# Patient Record
Sex: Female | Born: 1970 | Race: White | Hispanic: No | Marital: Single | Smoking: Never smoker
Health system: Southern US, Community
[De-identification: ages and names within clinical notes are randomized; demographics above are authoritative.]

## PROBLEM LIST (undated history)

## (undated) DIAGNOSIS — T1491XA Suicide attempt, initial encounter: Secondary | ICD-10-CM

## (undated) DIAGNOSIS — N3000 Acute cystitis without hematuria: Secondary | ICD-10-CM

## (undated) DIAGNOSIS — F329 Major depressive disorder, single episode, unspecified: Secondary | ICD-10-CM

## (undated) DIAGNOSIS — F32A Depression, unspecified: Secondary | ICD-10-CM

## (undated) DIAGNOSIS — N390 Urinary tract infection, site not specified: Secondary | ICD-10-CM

## (undated) DIAGNOSIS — D649 Anemia, unspecified: Secondary | ICD-10-CM

## (undated) DIAGNOSIS — Z9109 Other allergy status, other than to drugs and biological substances: Secondary | ICD-10-CM

## (undated) DIAGNOSIS — N83201 Unspecified ovarian cyst, right side: Secondary | ICD-10-CM

## (undated) HISTORY — PX: OTHER SURGICAL HISTORY: SHX169

## (undated) HISTORY — DX: Suicide attempt, initial encounter: T14.91XA

## (undated) HISTORY — PX: TONSILLECTOMY: SUR1361

---

## 2005-09-02 ENCOUNTER — Ambulatory Visit: Payer: Self-pay | Admitting: Family Medicine

## 2005-09-30 ENCOUNTER — Ambulatory Visit: Payer: Self-pay | Admitting: Family Medicine

## 2006-05-26 ENCOUNTER — Ambulatory Visit: Payer: Self-pay | Admitting: Family Medicine

## 2006-05-26 LAB — CONVERTED CEMR LAB
Bilirubin Urine: NEGATIVE
Glucose, Urine, Semiquant: NEGATIVE
Ketones, urine, test strip: NEGATIVE
Specific Gravity, Urine: 1.02

## 2006-06-22 ENCOUNTER — Ambulatory Visit: Payer: Self-pay | Admitting: Family Medicine

## 2006-06-22 DIAGNOSIS — J019 Acute sinusitis, unspecified: Secondary | ICD-10-CM

## 2006-11-03 ENCOUNTER — Ambulatory Visit: Payer: Self-pay | Admitting: Family Medicine

## 2006-11-03 DIAGNOSIS — F411 Generalized anxiety disorder: Secondary | ICD-10-CM | POA: Insufficient documentation

## 2006-11-16 ENCOUNTER — Ambulatory Visit: Payer: Self-pay | Admitting: Family Medicine

## 2007-07-30 ENCOUNTER — Ambulatory Visit: Payer: Self-pay | Admitting: Family Medicine

## 2007-07-30 DIAGNOSIS — F329 Major depressive disorder, single episode, unspecified: Secondary | ICD-10-CM | POA: Insufficient documentation

## 2008-04-25 ENCOUNTER — Ambulatory Visit: Payer: Self-pay | Admitting: Family Medicine

## 2008-06-08 ENCOUNTER — Ambulatory Visit: Payer: Self-pay | Admitting: Family Medicine

## 2008-10-27 ENCOUNTER — Ambulatory Visit: Payer: Self-pay | Admitting: Family Medicine

## 2008-10-27 DIAGNOSIS — H8110 Benign paroxysmal vertigo, unspecified ear: Secondary | ICD-10-CM | POA: Insufficient documentation

## 2010-10-17 ENCOUNTER — Other Ambulatory Visit: Payer: Self-pay | Admitting: Family Medicine

## 2010-10-22 NOTE — Telephone Encounter (Signed)
Tried to call pt home number, it has been disconnected.  Pt is overdue for follow up.

## 2010-11-07 ENCOUNTER — Encounter: Payer: Self-pay | Admitting: Family Medicine

## 2010-11-07 ENCOUNTER — Ambulatory Visit (INDEPENDENT_AMBULATORY_CARE_PROVIDER_SITE_OTHER): Payer: Private Health Insurance - Indemnity | Admitting: Family Medicine

## 2010-11-07 VITALS — BP 85/57 | HR 71 | Ht 63.5 in | Wt 135.0 lb

## 2010-11-07 DIAGNOSIS — F3289 Other specified depressive episodes: Secondary | ICD-10-CM

## 2010-11-07 DIAGNOSIS — F329 Major depressive disorder, single episode, unspecified: Secondary | ICD-10-CM

## 2010-11-07 MED ORDER — FLUOXETINE HCL 40 MG PO CAPS: 40.0000 mg | ORAL_CAPSULE | Freq: Every day | ORAL | Status: DC

## 2010-11-07 MED ORDER — FLUTICASONE FUROATE 27.5 MCG/SPRAY NA SUSP: 2.0000 | Freq: Every day | NASAL | Status: DC

## 2010-11-07 NOTE — Progress Notes (Signed)
  Subjective:    Patient ID: Brenda Sutton, female    DOB: 1970/09/10, 40 y.o.   MRN: [id number]  HPI Niece recently committed suicide. Getting  Help through EAP through work.  She has been out of her meds for 3-4 weeks. Work is going well. Now has a son in [employer]. She would like to restart her meds. She hasn't been her since 2010 and says really she goes on and off hre prozac depending on how she feels. She still has some xanax left and doesn't need a refill right now. Has had to use 2 in the last month.   Also chronic nasal congestion. Feels like it is blocked over her nasal bridge. No fever or discharge, or sneszing. No prior hx of allergies.  She had surgery on her nose after a soft ball injury years ago.  Not recently worse but just chronic. Had neg allergy testing in the past.    Review of Systems     Objective:   Physical Exam  Constitutional: She appears well-developed and well-nourished.  HENT:  Head: Normocephalic and atraumatic.  Right Ear: External ear normal.  Left Ear: External ear normal.  Nose: Nose normal.  Mouth/Throat: Oropharynx is clear and moist.       Turbinates are swollen bilat.   Eyes: Conjunctivae and EOM are normal. Pupils are equal, round, and reactive to light.  Neck: Neck supple. No thyromegaly present.  Cardiovascular: Normal rate and regular rhythm.   Pulmonary/Chest: Effort normal and breath sounds normal.  Lymphadenopathy:    She has no cervical adenopathy.          Assessment & Plan:  Chronic nasal congestion - Does have swollen turbinates on exam. Will treat with a nasal steroid. If not improving then will refer to ENT. No sign of infection, etc.

## 2010-11-07 NOTE — Assessment & Plan Note (Signed)
Encouraged her to start with EAP at work.  Her PHQ-9 score is  6 today.  F/U in 1 year if doing well Call sooner if meds need to be adjusted.

## 2010-11-07 NOTE — Patient Instructions (Signed)
If doing well follow up in one year.  Also if nasal congestion not better then let me know and will can refer you to ENT.  Veramyst is 2 sprays in each nostril once a day. Make sure to tilt your chin forward (not backwards) when spraying.

## 2011-01-13 ENCOUNTER — Ambulatory Visit (INDEPENDENT_AMBULATORY_CARE_PROVIDER_SITE_OTHER): Payer: Private Health Insurance - Indemnity | Admitting: Family Medicine

## 2011-01-13 ENCOUNTER — Encounter: Payer: Self-pay | Admitting: Family Medicine

## 2011-01-13 DIAGNOSIS — R5383 Other fatigue: Secondary | ICD-10-CM

## 2011-01-13 DIAGNOSIS — M7989 Other specified soft tissue disorders: Secondary | ICD-10-CM

## 2011-01-13 LAB — POCT URINALYSIS DIPSTICK
Leukocytes, UA: NEGATIVE
Protein, UA: NEGATIVE
pH, UA: 7

## 2011-01-13 NOTE — Progress Notes (Signed)
  Subjective:    Patient ID: Brenda Sutton, female    DOB: 1970-11-19, 40 y.o.   MRN: [id number]  HPI 40 yo WF presents for swelling in her hands and feet that started after she went camping last weekend (8 days ago).  She had been sweating a lot while hiking and drank lots of water.  She has a mild HA and lightheadedness now.  Denies diarrhea, abd pain, fevers or chills.  Denies nausea.  She denies any improvement in the AM or with leg elevation.  She has cuts and bug bites but none seem to be infected.  Her hand swelling is improving and she can wear her ring now.  No problems urinating.  BP 105/53  Pulse 65  Temp(Src) 98.4 F (36.9 C) (Oral)  Wt 133 lb (60.328 kg)  SpO2 96%  LMP 01/06/2011     Review of Systems  Constitutional: Positive for fatigue. Negative for fever, chills and unexpected weight change.  Eyes: Negative for visual disturbance.  Respiratory: Negative for shortness of breath.   Cardiovascular: Negative for chest pain and palpitations.  Genitourinary: Negative for difficulty urinating.  Neurological: Positive for light-headedness and headaches. Negative for tremors, weakness and numbness.  Psychiatric/Behavioral: Negative for confusion.       Objective:   Physical Exam  Constitutional: She appears well-developed and well-nourished.  HENT:  Mouth/Throat: Oropharynx is clear and moist.  Eyes: No scleral icterus.  Neck: Neck supple. Thyromegaly present.  Cardiovascular: Normal rate, normal heart sounds and intact distal pulses.   No murmur heard. Pulmonary/Chest: Effort normal and breath sounds normal. No respiratory distress.  Abdominal: Soft.  Musculoskeletal: She exhibits edema (1+ bilat pedal edema, no appreciable UE edema).  Lymphadenopathy:    She has no cervical adenopathy.  Skin: Skin is warm and dry.       Small abrasions and insect bites, none with surrounding erythema          Assessment & Plan:  Edema-  Ua is normal, neg for  proteinuria. Will get a CMP, CBC and TSH today.  She is at risk for hyponatremia with her recent hiking in the heat with drinking lots of water and concurrent HAs and lightheadedness.  F/u lab results tomorrow.    Elevate legs.  Start fluid restrction, changing water to gatorade.

## 2011-01-13 NOTE — Patient Instructions (Signed)
UA is normal.  Bloodwork downstairs today.  Elevate legs. Do no drink more than 32 oz of fluid/ day for the next 3 days.   Will call you w/ lab results tomorrow.

## 2011-01-14 ENCOUNTER — Telehealth: Payer: Self-pay | Admitting: Family Medicine

## 2011-01-14 LAB — COMPLETE METABOLIC PANEL WITH GFR
ALT: 36 U/L — ABNORMAL HIGH (ref 0–35)
Albumin: 4.1 g/dL (ref 3.5–5.2)
BUN: 8 mg/dL (ref 6–23)
CO2: 23 mEq/L (ref 19–32)
Calcium: 9.4 mg/dL (ref 8.4–10.5)
Chloride: 108 mEq/L (ref 96–112)
Creat: 0.71 mg/dL (ref 0.50–1.10)
GFR, Est African American: >60 mL/min (ref >60)
Potassium: 4.4 mEq/L (ref 3.5–5.3)

## 2011-01-14 LAB — CBC WITH DIFFERENTIAL/PLATELET
Eosinophils Relative: 4 % (ref 0–5)
HCT: 33.6 % — ABNORMAL LOW (ref 36.0–46.0)
Hemoglobin: 11.1 g/dL — ABNORMAL LOW (ref 12.0–15.0)
Lymphocytes Relative: 32 % (ref 12–46)
Lymphs Abs: 1.4 10*3/uL (ref 0.7–4.0)
MCV: 92.1 fL (ref 78.0–100.0)
Monocytes Absolute: 0.4 10*3/uL (ref 0.1–1.0)
RBC: 3.65 MIL/uL — ABNORMAL LOW (ref 3.87–5.11)
WBC: 4.5 10*3/uL (ref 4.0–10.5)

## 2011-01-14 LAB — TSH: TSH: 4.17 u[IU]/mL (ref 0.350–4.500)

## 2011-01-14 MED ORDER — FUROSEMIDE 40 MG PO TABS: 40.0000 mg | ORAL_TABLET | Freq: Every day | ORAL | Status: DC

## 2011-01-14 MED ORDER — FUROSEMIDE 40 MG PO TABS: 120.0000 mg | ORAL_TABLET | Freq: Every day | ORAL | Status: DC

## 2011-01-14 NOTE — Telephone Encounter (Signed)
No answer and no machine

## 2011-01-14 NOTE — Telephone Encounter (Signed)
Pls let pt know that her labs show no sign of electrolyte imbalance or kidney problem.  She is mildly anemic.  I am going to start her on Lasix 40 mg once daily x 5 days to help w/ her swelling.  Eat 1 banana/ day to replace potassium lost by Lasix. Elevate legs and can increase fluid intake back up to 64 oz/ day but no more than this.    I suggest she f/u with Dr Linford Arnold in 1 wk to recheck her swelling and workup her anemia.

## 2011-08-01 ENCOUNTER — Telehealth: Payer: Self-pay | Admitting: *Deleted

## 2011-08-01 NOTE — Telephone Encounter (Signed)
Pt calls and states last night nose running, congested, bodyaches. Denies H/A or cough or fever. Boyfriend had it 2 weeks ago. Uses [facility] on [address]

## 2011-08-01 NOTE — Telephone Encounter (Signed)
Liklely URI. If not better in one week then let me know.

## 2011-08-04 NOTE — Telephone Encounter (Signed)
Tried to call pt back- phone has been disconnected 

## 2012-02-09 ENCOUNTER — Telehealth: Payer: Self-pay | Admitting: Family Medicine

## 2012-02-09 NOTE — Telephone Encounter (Signed)
Call patient: I received a letter from her insurance company stating that she is due for her Pap smear. We will be happy to schedule her here or if she has an OB/GYN she can call to make an appointment there.

## 2012-02-10 NOTE — Telephone Encounter (Signed)
Number has been d/c will mail letter

## 2012-03-16 ENCOUNTER — Encounter: Payer: Self-pay | Admitting: Family Medicine

## 2012-03-16 ENCOUNTER — Ambulatory Visit (INDEPENDENT_AMBULATORY_CARE_PROVIDER_SITE_OTHER): Payer: Private Health Insurance - Indemnity | Admitting: Family Medicine

## 2012-03-16 VITALS — BP 100/68 | HR 80 | Wt 131.0 lb

## 2012-03-16 DIAGNOSIS — F341 Dysthymic disorder: Secondary | ICD-10-CM

## 2012-03-16 DIAGNOSIS — L301 Dyshidrosis [pompholyx]: Secondary | ICD-10-CM

## 2012-03-16 DIAGNOSIS — F329 Major depressive disorder, single episode, unspecified: Secondary | ICD-10-CM

## 2012-03-16 DIAGNOSIS — Z23 Encounter for immunization: Secondary | ICD-10-CM

## 2012-03-16 MED ORDER — CLOBETASOL PROPIONATE 0.05 % EX OINT: TOPICAL_OINTMENT | Freq: Every day | CUTANEOUS | Status: AC

## 2012-03-16 MED ORDER — ALPRAZOLAM 0.5 MG PO TABS: 0.5000 mg | ORAL_TABLET | Freq: Every evening | ORAL | Status: DC | PRN

## 2012-03-16 MED ORDER — FLUOXETINE HCL 40 MG PO CAPS: 40.0000 mg | ORAL_CAPSULE | Freq: Every day | ORAL | Status: DC

## 2012-03-16 NOTE — Progress Notes (Signed)
  Subjective:    Patient ID: Brenda Sutton, female    DOB: Dec 02, 1970, 41 y.o.   MRN: [id number]  HPI Rash that comes and goes, usually worse in the summer. GEts tiny blisters. Will get a cluster of them.  On both hands.  Brenda Sutton gets more on her right hand.  Very itchy. Skin will sometimes swell andd then peal.  Son with eczema.    Anxiety/Depression - Wokr has been very stressful. Son started back at school.  She says it has been increasing month. She does feel her medication works well for her and that does want a refill. She does not want any adjustments to her medication.   Review of Systems     Objective:   Physical Exam  Constitutional: She appears well-developed and well-nourished.  HENT:  Head: Normocephalic and atraumatic.  Skin: Skin is warm and dry.       On the palms of her hand near her fingers and in between her fingers she has a fine papular rash. No significant erythema. Some peeling of the skin. No active drainage.  Psychiatric: She has a normal mood and affect. Her behavior is normal.          Assessment & Plan:  Dishydrotic eczema - discussed diagnosis. Handout given. Should respond well to a topical steroid.  Call if not helping after a copule of weeks.    Anxiety - GAD- 7 score of 15, she wants refill on the fluoxetine. She doesn't want to ajust her dose. She feels things will get better over the next couple of months. I encouraged her call me if things don't get better. We could consider adjusting her medication or possibly getting her into counseling and she would like.

## 2012-03-16 NOTE — Patient Instructions (Signed)
Hand Dermatitis Hand dermatitis (dyshidrotic eczema) is a skin condition in which small, itchy, raised dots or fluid-filled blisters form over the palms of the hands. Outbreaks of hand dermatitis can last 3 to 4 weeks. CAUSES  The cause of hand dermatitis is unknown. However, it occurs most often in patients with a history of allergies such as:  Hay fever.   Allergic asthma.   Allergies to latex.  Chemical exposure, injuries, and environmental irritants can make hand dermatitis worse. Washing your hands too frequently can remove natural oils, which can dry out the skin and contribute to outbreaks of hand dermatitis. SYMPTOMS  The most common symptom of hand dermatitis is intense itching. Cracks or grooves (fissures) on the fingers can also develop. Affected areas can be painful, especially areas where large blisters have formed. DIAGNOSIS Your caregiver can usually tell what the problem is by doing a physical exam. PREVENTION  Avoid excessive hand washing.   Avoid the use of harsh chemicals.   Wear protective gloves when handling products that can irritate your skin.  TREATMENT  Steroid creams and ointments, such as over-the-counter 1% hydrocortisone cream, can reduce inflammation and improve moisture retention. These should be applied at least 2 to 4 times per day. Your caregiver may ask you to use a stronger prescription steroid cream to help speed the healing of blistered and cracked skin. In severe cases, oral steroid medicine may be needed. If you have an infection, antibiotics may be needed. Your caregiver may also prescribe antihistamines. These medicines help reduce itching. HOME CARE INSTRUCTIONS  Only take over-the-counter or prescription medicines as directed by your caregiver.   You may use wet or cold compresses. This can help:   Alleviate itching.   Increase the effectiveness of topical creams.   Minimize blisters.  SEEK MEDICAL CARE IF:  The rash is not better  after 1 week of treatment.   Signs of infection develop, such as redness, tenderness, or yellowish-white fluid (pus).   The rash is spreading.  Document Released: 06/23/2005 Document Revised: 06/12/2011 Document Reviewed: 11/20/2010 Exit[employer] Patient Information 2012 [employer]. 

## 2013-03-04 ENCOUNTER — Ambulatory Visit (INDEPENDENT_AMBULATORY_CARE_PROVIDER_SITE_OTHER): Payer: Private Health Insurance - Indemnity | Admitting: Family Medicine

## 2013-03-04 ENCOUNTER — Encounter: Payer: Self-pay | Admitting: Family Medicine

## 2013-03-04 VITALS — BP 94/61 | HR 62 | Wt 135.0 lb

## 2013-03-04 DIAGNOSIS — F329 Major depressive disorder, single episode, unspecified: Secondary | ICD-10-CM

## 2013-03-04 DIAGNOSIS — M7551 Bursitis of right shoulder: Secondary | ICD-10-CM

## 2013-03-04 DIAGNOSIS — M67919 Unspecified disorder of synovium and tendon, unspecified shoulder: Secondary | ICD-10-CM

## 2013-03-04 MED ORDER — ALPRAZOLAM 0.5 MG PO TABS: 0.5000 mg | ORAL_TABLET | Freq: Every day | ORAL | Status: DC | PRN

## 2013-03-04 NOTE — Progress Notes (Signed)
Subjective:    Patient ID: Brenda Sutton, female    DOB: [birth year], 42 y.o.   MRN: [id number]  HPI Right shoulder pain for 1 month. No initial injury or trauma. No rash or swelling. Worse with movement. When threw her towel over her when getting out fo the shower felt pain over the upper arm. Sometimes feels like the muscle has cramped in her upper arm. Takes a few min to go asway.  Occ will get a dull ache near her wrist as well.  Using IBU prn for relief.  Can;;t sleep on that side.  No heat or ice. Normal ROM.    Depression - doiing well on prozac. Does need a refill on her xanax. Uses it sparingly.  Says acutlaly had half a bottle left and dropped it down the sink.    Review of Systems     BP 94/61  Pulse 62  Wt 135 lb (61.236 kg)  BMI 23.54 kg/m2  SpO2 99%    Allergies  Allergen Reactions  . Erythromycin     Past Medical History  Diagnosis Date  . Suicide attempt     at age 29    Past Surgical History  Procedure Laterality Date  . Tonsillectomy      History   Social History  . Marital Status: Single    Spouse Name: N/A    Number of Children: N/A  . Years of Education: N/A   Occupational History  . Not on file.   Social History Main Topics  . Smoking status: Never Smoker   . Smokeless tobacco: Not on file  . Alcohol Use: 0.5 oz/week    1 drink(s) per week  . Drug Use: Yes    Special: Marijuana  . Sexual Activity: Not on file   Other Topics Concern  . Not on file   Social History Narrative  . No narrative on file    Family History  Problem Relation Age of Onset  .       Outpatient Encounter Prescriptions as of 03/04/2013  Medication Sig Dispense Refill  . clobetasol ointment (TEMOVATE) 0.05 % Apply topically daily.  60 g  0  . FLUoxetine (PROZAC) 40 MG capsule Take 1 capsule (40 mg total) by mouth daily.  90 capsule  3  . ALPRAZolam (XANAX) 0.5 MG tablet Take 1 tablet (0.5 mg total) by mouth daily as needed.  30 tablet  0  . fluticasone  (VERAMYST) 27.5 MCG/SPRAY nasal spray 2 sprays by Nasal route daily.  4.5 g  0  . furosemide (LASIX) 40 MG tablet Take 1 tablet (40 mg total) by mouth daily.  5 tablet  0   No facility-administered encounter medications on file as of 03/04/2013.       Objective:   Physical Exam  Constitutional: She is oriented to person, place, and time. She appears well-developed and well-nourished.  HENT:  Head: Normocephalic and atraumatic.  Eyes: Conjunctivae and EOM are normal.  Cardiovascular: Normal rate.   Pulmonary/Chest: Effort normal.  Musculoskeletal:  Right shoulder is nontender on exam. Normal range of motion. Strength is 5 out of 5 in the shoulder, elbow, wrist. Negative empty can test. No sign of biceps tendinitis. She did have a little bit of pain with crossover test. It was actually the only exam that elicited discomfort.  Neurological: She is alert and oriented to person, place, and time.  Skin: Skin is dry. No pallor.  Psychiatric: She has a normal mood and affect. Her behavior  is normal.          Assessment & Plan:  Shoulder bursitis , right - right shoulder pain-suspect bursitis based on exam and history. No sign of rotator cuff tear or strain. Handout given on exercises to do on her own home. Recommend start an anti-inflammatory. She has ibuprofen at home. Start with 60 mg 3 times a day for the next 5-7 days. After that she can use as needed. Make sure take with food and water and stop immediately if any GI upset or irritation. If not improving at that time encouraged her to call me. We can consider further workup, possible physical therapy, x-ray, and possible injection. Without any acute trauma or injury I did not order an x-ray today.  Depression - currently well controlled. She would like refill on her fluoxetine as well as her Xanax, which she seems to be doing.

## 2013-03-04 NOTE — Patient Instructions (Signed)
Ibuprofen 600mg  3 x a day for 5-7 days. Take with food and water Start the exercises Call if not better in 2-3 weeks.

## 2013-03-16 ENCOUNTER — Other Ambulatory Visit: Payer: Self-pay | Admitting: *Deleted

## 2013-03-16 MED ORDER — FLUOXETINE HCL 40 MG PO CAPS: 40.0000 mg | ORAL_CAPSULE | Freq: Every day | ORAL | Status: DC

## 2013-03-16 MED ORDER — ALPRAZOLAM 0.5 MG PO TABS: 0.5000 mg | ORAL_TABLET | Freq: Every day | ORAL | Status: DC | PRN

## 2013-03-16 NOTE — Telephone Encounter (Signed)
Spoke with pt and she states the pharmacy didn't receive her Xanax and Prozac refill. I sent the Prozac refill and the rx tech states alprazolam was picked up today. She is going to have the pharmacist to call back with who signed for the xanax.

## 2013-03-16 NOTE — Telephone Encounter (Signed)
Per pharmacist they didn't receive the rx for xanax. VO given.  Meyer Cory, LPN

## 2013-03-29 ENCOUNTER — Encounter: Payer: Self-pay | Admitting: Family Medicine

## 2013-03-29 ENCOUNTER — Ambulatory Visit (INDEPENDENT_AMBULATORY_CARE_PROVIDER_SITE_OTHER): Payer: Private Health Insurance - Indemnity | Admitting: Family Medicine

## 2013-03-29 VITALS — BP 96/64 | HR 69 | Ht 63.0 in | Wt 137.0 lb

## 2013-03-29 DIAGNOSIS — Z0000 Encounter for general adult medical examination without abnormal findings: Secondary | ICD-10-CM

## 2013-03-29 DIAGNOSIS — M25519 Pain in unspecified shoulder: Secondary | ICD-10-CM

## 2013-03-29 DIAGNOSIS — M25511 Pain in right shoulder: Secondary | ICD-10-CM

## 2013-03-29 DIAGNOSIS — Z1231 Encounter for screening mammogram for malignant neoplasm of breast: Secondary | ICD-10-CM

## 2013-03-29 DIAGNOSIS — Z23 Encounter for immunization: Secondary | ICD-10-CM

## 2013-03-29 LAB — COMPLETE METABOLIC PANEL WITH GFR
AST: 13 U/L (ref 0–37)
Albumin: 4.3 g/dL (ref 3.5–5.2)
Alkaline Phosphatase: 39 U/L (ref 39–117)
Potassium: 4.2 mEq/L (ref 3.5–5.3)
Sodium: 137 mEq/L (ref 135–145)
Total Protein: 7.2 g/dL (ref 6.0–8.3)

## 2013-03-29 LAB — CBC
Hemoglobin: 13.3 g/dL (ref 12.0–15.0)
MCH: 29.8 pg (ref 26.0–34.0)
MCHC: 34.5 g/dL (ref 30.0–36.0)
Platelets: 367 10*3/uL (ref 150–400)
RDW: 14.3 % (ref 11.5–15.5)
WBC: 3.2 10*3/uL — ABNORMAL LOW (ref 4.0–10.5)

## 2013-03-29 LAB — TSH: TSH: 7.794 u[IU]/mL — ABNORMAL HIGH (ref 0.350–4.500)

## 2013-03-29 LAB — LIPID PANEL
Cholesterol: 155 mg/dL (ref 0–200)
HDL: 60 mg/dL (ref >39)
Total CHOL/HDL Ratio: 2.6 Ratio
VLDL: 11 mg/dL (ref 0–40)

## 2013-03-29 NOTE — Patient Instructions (Addendum)
Keep up a regular exercise program and make sure you are eating a healthy diet Try to eat 4 servings of dairy a day, or if you are lactose intolerant take a calcium with vitamin D daily.  Your vaccines are up to date.   

## 2013-03-29 NOTE — Progress Notes (Signed)
  Subjective:     Brenda Sutton is a 42 y.o. female and is here for a comprehensive physical exam. The patient reports no problems.  History   Social History  . Marital Status: Single    Spouse Name: N/A    Number of Children: N/A  . Years of Education: N/A   Occupational History  . Not on file.   Social History Main Topics  . Smoking status: Never Smoker   . Smokeless tobacco: Not on file  . Alcohol Use: 1.0 oz/week    2 drink(s) per week  . Drug Use: Yes    Special: Marijuana  . Sexual Activity: Not on file   Other Topics Concern  . Not on file   Social History Narrative   No regular exercise.    Health Maintenance  Topic Date Due  . Influenza Vaccine  02/04/2013  . Pap Smear  03/29/2014  . Tetanus/tdap  03/16/2022    The following portions of the patient's history were reviewed and updated as appropriate: allergies, current medications, past family history, past medical history, past social history, past surgical history and problem list.  Review of Systems A comprehensive review of systems was negative.   Objective:    BP 96/64  Pulse 69  Ht 5\' 3"  (1.6 m)  Wt 137 lb (62.143 kg)  BMI 24.27 kg/m2 General appearance: alert, cooperative and appears stated age Head: Normocephalic, without obvious abnormality, atraumatic Eyes: conj clear, EOMi, PEERLA Ears: normal TM's and external ear canals both ears Nose: Nares normal. Septum midline. Mucosa normal. No drainage or sinus tenderness. Throat: lips, mucosa, and tongue normal; teeth and gums normal Neck: no adenopathy, no carotid bruit, no JVD, supple, symmetrical, trachea midline and thyroid not enlarged, symmetric, no tenderness/mass/nodules Back: symmetric, no curvature. ROM normal. No CVA tenderness. Lungs: clear to auscultation bilaterally Breasts: normal appearance, no masses or tenderness Heart: regular rate and rhythm, S1, S2 normal, no murmur, click, rub or gallop Abdomen: soft, non-tender; bowel  sounds normal; no masses,  no organomegaly Extremities: extremities normal, atraumatic, no cyanosis or edema Pulses: 2+ and symmetric Skin: Skin color, texture, turgor normal. No rashes or lesions Lymph nodes: Cervical, supraclavicular, and axillary nodes normal. Neurologic: Alert and oriented X 3, normal strength and tone. Normal symmetric reflexes. Normal coordination and gait    Assessment:    Healthy female exam.    Plan:     See After Visit Summary for Counseling Recommendations  Keep up a regular exercise program and make sure you are eating a healthy diet Try to eat 4 servings of dairy a day, or if you are lactose intolerant take a calcium with vitamin D daily.  Your vaccines are up to date.  Due for screening labwork. Due for screening mammogram. Will place order. Due for Pap smear next year with her OB/GYN. Given flu vaccine today. 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   Right shoulder pain-she was seen about a month ago for possible right shoulder bursitis. She has been doing home exercises and has noticed a moderate improvement. She's just been trying not to overdo it is still having pain. We will go ahead and get an x-ray today. Consider formal physical therapy or possibly an injection if the x-ray is reassuring. We'll call the results once available.

## 2013-05-16 ENCOUNTER — Ambulatory Visit (INDEPENDENT_AMBULATORY_CARE_PROVIDER_SITE_OTHER): Payer: Private Health Insurance - Indemnity

## 2013-05-16 ENCOUNTER — Encounter: Payer: Self-pay | Admitting: Family Medicine

## 2013-05-16 ENCOUNTER — Telehealth: Payer: Self-pay | Admitting: *Deleted

## 2013-05-16 ENCOUNTER — Other Ambulatory Visit: Payer: Self-pay | Admitting: Family Medicine

## 2013-05-16 DIAGNOSIS — S43109A Unspecified dislocation of unspecified acromioclavicular joint, initial encounter: Secondary | ICD-10-CM

## 2013-05-16 DIAGNOSIS — M25511 Pain in right shoulder: Secondary | ICD-10-CM

## 2013-05-16 DIAGNOSIS — E039 Hypothyroidism, unspecified: Secondary | ICD-10-CM | POA: Insufficient documentation

## 2013-05-16 DIAGNOSIS — R6889 Other general symptoms and signs: Secondary | ICD-10-CM

## 2013-05-16 DIAGNOSIS — X58XXXA Exposure to other specified factors, initial encounter: Secondary | ICD-10-CM

## 2013-05-16 LAB — TSH: TSH: 5.644 u[IU]/mL — ABNORMAL HIGH (ref 0.350–4.500)

## 2013-05-16 LAB — T3, FREE: T3, Free: 2.7 pg/mL (ref 2.3–4.2)

## 2013-05-16 MED ORDER — LEVOTHYROXINE SODIUM 25 MCG PO TABS: 25.0000 ug | ORAL_TABLET | Freq: Every day | ORAL | Status: DC

## 2013-05-17 ENCOUNTER — Other Ambulatory Visit: Payer: Self-pay | Admitting: *Deleted

## 2013-05-17 ENCOUNTER — Ambulatory Visit (INDEPENDENT_AMBULATORY_CARE_PROVIDER_SITE_OTHER): Payer: Private Health Insurance - Indemnity

## 2013-05-17 DIAGNOSIS — Z1231 Encounter for screening mammogram for malignant neoplasm of breast: Secondary | ICD-10-CM

## 2013-05-17 MED ORDER — LEVOTHYROXINE SODIUM 25 MCG PO TABS: 25.0000 ug | ORAL_TABLET | Freq: Every day | ORAL | Status: DC

## 2013-06-24 ENCOUNTER — Ambulatory Visit (INDEPENDENT_AMBULATORY_CARE_PROVIDER_SITE_OTHER): Payer: Private Health Insurance - Indemnity | Admitting: Family Medicine

## 2013-06-24 ENCOUNTER — Encounter: Payer: Self-pay | Admitting: Family Medicine

## 2013-06-24 VITALS — BP 98/61 | HR 74 | Temp 98.2°F | Ht 63.0 in | Wt 133.0 lb

## 2013-06-24 DIAGNOSIS — E041 Nontoxic single thyroid nodule: Secondary | ICD-10-CM

## 2013-06-24 DIAGNOSIS — E039 Hypothyroidism, unspecified: Secondary | ICD-10-CM

## 2013-06-24 DIAGNOSIS — R0981 Nasal congestion: Secondary | ICD-10-CM

## 2013-06-24 DIAGNOSIS — J3489 Other specified disorders of nose and nasal sinuses: Secondary | ICD-10-CM

## 2013-06-24 MED ORDER — AZELASTINE HCL 0.1 % NA SOLN: 1.0000 | Freq: Two times a day (BID) | NASAL | Status: DC

## 2013-06-24 NOTE — Progress Notes (Signed)
   Subjective:    Patient ID: Brenda Sutton, female    DOB: 1970-10-25, 42 y.o.   MRN: [id number]  HPI Hypothyroid - new diagnosis. Started medication about 4 weeks ago. Has noticed some more energy. Has lost 4 lbs.  No palpitations.  Tolerating medications well without any side effects or problems. No recent hair or skin changes. She says she always feels cold but has not noticed a difference or not since starting the medication.  Right side of nose feel blocked. Typically happens in the winter time. Last year had given her nasal steroid spray which did seem to help but she says it eventually made her breasts sore to the point that she fell she was pregnant. So she discontinued that medication. She wants and if they're any other options. Note, she had major trauma to her nose when she was younger and had to have repair of her septum as it was shattered after she was in the face with a softball.   Review of Systems     Objective:   Physical Exam  Constitutional: She appears well-developed and well-nourished.  HENT:  Head: Normocephalic and atraumatic.  Turbinates are not swollen. She does have septal deviation to the right. Did not visualize any polyps.  Neck: Neck supple. No thyromegaly present.  Up a nodule on the thyroid gland or either on the trachea. I'm not sure which is very firm to schedule her for an ultrasound for further evaluation.  Lymphadenopathy:    She has no cervical adenopathy.          Assessment & Plan:  Hypothyroidism-new diagnosis. Repeat labs today and adjust dose as needed. She's tolerating it well and has actually noticed some increased energy level. Has lost 4 pounds since I last saw her. We'll continue keep an eye on this.  Nodule in thyroid gland-will schedule for ultrasound for further evaluation.  Right-sided nasal congestion. I do not visualize a polyp on exam today. She did not tolerate a nasal steroid spray will try a nasal antihistamine. Call if  not helping her working. Also recommend using a humidifier at bedtime.

## 2013-07-02 LAB — T4, FREE: Free T4: 1 ng/dL (ref 0.80–1.80)

## 2013-07-04 ENCOUNTER — Ambulatory Visit (INDEPENDENT_AMBULATORY_CARE_PROVIDER_SITE_OTHER): Payer: Managed Care, Other (non HMO)

## 2013-07-04 DIAGNOSIS — E039 Hypothyroidism, unspecified: Secondary | ICD-10-CM

## 2013-07-04 DIAGNOSIS — R0981 Nasal congestion: Secondary | ICD-10-CM

## 2013-07-04 DIAGNOSIS — E041 Nontoxic single thyroid nodule: Secondary | ICD-10-CM

## 2013-07-19 ENCOUNTER — Other Ambulatory Visit: Payer: Self-pay | Admitting: Family Medicine

## 2013-07-24 ENCOUNTER — Other Ambulatory Visit: Payer: Self-pay | Admitting: Family Medicine

## 2013-08-10 ENCOUNTER — Other Ambulatory Visit: Payer: Self-pay | Admitting: *Deleted

## 2013-08-10 MED ORDER — LEVOTHYROXINE SODIUM 25 MCG PO TABS: ORAL_TABLET | ORAL | Status: DC

## 2013-08-26 ENCOUNTER — Ambulatory Visit: Payer: Managed Care, Other (non HMO) | Admitting: Physician Assistant

## 2013-10-27 ENCOUNTER — Ambulatory Visit (INDEPENDENT_AMBULATORY_CARE_PROVIDER_SITE_OTHER): Payer: Managed Care, Other (non HMO) | Admitting: Sports Medicine

## 2013-10-27 ENCOUNTER — Ambulatory Visit (INDEPENDENT_AMBULATORY_CARE_PROVIDER_SITE_OTHER): Payer: Managed Care, Other (non HMO)

## 2013-10-27 ENCOUNTER — Encounter: Payer: Self-pay | Admitting: Sports Medicine

## 2013-10-27 VITALS — BP 87/56 | HR 72 | Ht 63.0 in | Wt 137.0 lb

## 2013-10-27 DIAGNOSIS — S93529A Sprain of metatarsophalangeal joint of unspecified toe(s), initial encounter: Secondary | ICD-10-CM

## 2013-10-27 DIAGNOSIS — M79609 Pain in unspecified limb: Secondary | ICD-10-CM

## 2013-10-27 MED ORDER — MELOXICAM 15 MG PO TABS
ORAL_TABLET | ORAL | Status: DC
Start: 2013-10-27 — End: 2014-08-15

## 2013-10-27 NOTE — Assessment & Plan Note (Signed)
This likely represents left first MTP synovitis, I think she just overdid it over the past week her she was working outside for [employer]. Mobic, x-rays, she will wear a rigid soled shoe and do PRICE at home. Return in 2 weeks, injection if no better.

## 2013-10-27 NOTE — Progress Notes (Signed)
   Subjective:    I'm seeing this patient as a consultation for:  Dr. Madilyn Fireman  CC: Left great toe pain.  HPI: Last week this very pleasant 43 year old female worked outside significantly for an [employer] celebration, now she has significant pain and mild swelling that she localizes precisely over the left first metatarsophalangeal joint, worse with bearing weight, moderate, persistent.  Past medical history, Surgical history, Family history not pertinant except as noted below, Social history, Allergies, and medications have been entered into the medical record, reviewed, and no changes needed.   Review of Systems: No headache, visual changes, nausea, vomiting, diarrhea, constipation, dizziness, abdominal pain, skin rash, fevers, chills, night sweats, weight loss, swollen lymph nodes, body aches, joint swelling, muscle aches, chest pain, shortness of breath, mood changes, visual or auditory hallucinations.   Objective:   General: Well Developed, well nourished, and in no acute distress.  Neuro/Psych: Alert and oriented x3, extra-ocular muscles intact, able to move all 4 extremities, sensation grossly intact. Skin: Warm and dry, no rashes noted.  Respiratory: Not using accessory muscles, speaking in full sentences, trachea midline.  Cardiovascular: Pulses palpable, no extremity edema. Abdomen: Does not appear distended. Left Foot: No visible erythema or swelling. Range of motion is full in all directions. Strength is 5/5 in all directions. No hallux valgus. No pes cavus or pes planus. No abnormal callus noted. No pain over the navicular prominence, or base of fifth metatarsal. No tenderness to palpation of the calcaneal insertion of plantar fascia. No pain at the Achilles insertion. No pain over the calcaneal bursa. No pain of the retrocalcaneal bursa. Tender to palpation at the first metatarsophalangeal joint with reproduction of pain with both passive dorsiflexion and plantar  flexion. There is also reproduction of pain with axial loading of the MTP. No hallux rigidus or limitus. No tenderness palpation over interphalangeal joints. No pain with compression of the metatarsal heads. Neurovascularly intact distally.  X-rays personally reviewed and showed extremely minimal degenerative changes at the metatarsophalangeal joint, predominantly at the lateral aspect.  Impression and Recommendations:   This case required medical decision making of moderate complexity.

## 2013-11-11 ENCOUNTER — Ambulatory Visit (INDEPENDENT_AMBULATORY_CARE_PROVIDER_SITE_OTHER): Payer: Managed Care, Other (non HMO) | Admitting: Sports Medicine

## 2013-11-11 ENCOUNTER — Encounter: Payer: Self-pay | Admitting: Sports Medicine

## 2013-11-11 VITALS — BP 96/54 | HR 61 | Ht 63.0 in | Wt 136.0 lb

## 2013-11-11 DIAGNOSIS — S93529A Sprain of metatarsophalangeal joint of unspecified toe(s), initial encounter: Secondary | ICD-10-CM

## 2013-11-11 NOTE — Progress Notes (Signed)
  Subjective:    CC: Follow up  HPI: This is a pleasant 43 year old female, I saw her several weeks ago with pain at the first metatarsophalangeal joint, likely synovitis from overuse. We treated her conservatively and her pain is now resolved.  Past medical history, Surgical history, Family history not pertinant except as noted below, Social history, Allergies, and medications have been entered into the medical record, reviewed, and no changes needed.   Review of Systems: No fevers, chills, night sweats, weight loss, chest pain, or shortness of breath.   Objective:    General: Well Developed, well nourished, and in no acute distress.  Neuro: Alert and oriented x3, extra-ocular muscles intact, sensation grossly intact.  HEENT: Normocephalic, atraumatic, pupils equal round reactive to light, neck supple, no masses, no lymphadenopathy, thyroid nonpalpable.  Skin: Warm and dry, no rashes. Cardiac: Regular rate and rhythm, no murmurs rubs or gallops, no lower extremity edema.  Respiratory: Clear to auscultation bilaterally. Not using accessory muscles, speaking in full sentences. Foot: No visible erythema or swelling. Range of motion is full in all directions. Strength is 5/5 in all directions. No hallux valgus. No pes cavus or pes planus. No abnormal callus noted. No pain over the navicular prominence, or base of fifth metatarsal. No tenderness to palpation of the calcaneal insertion of plantar fascia. No pain at the Achilles insertion. No pain over the calcaneal bursa. No pain of the retrocalcaneal bursa. No tenderness to palpation over the tarsals, metatarsals, or phalanges. No hallux rigidus or limitus. No tenderness palpation over interphalangeal joints. No pain with compression of the metatarsal heads. Neurovascularly intact distally.  Impression and Recommendations:

## 2013-11-11 NOTE — Assessment & Plan Note (Signed)
Pain resolved, return as needed.

## 2014-07-17 ENCOUNTER — Other Ambulatory Visit: Payer: Self-pay | Admitting: Family Medicine

## 2014-08-15 ENCOUNTER — Encounter: Payer: Self-pay | Admitting: Family Medicine

## 2014-08-15 ENCOUNTER — Ambulatory Visit (INDEPENDENT_AMBULATORY_CARE_PROVIDER_SITE_OTHER): Payer: Managed Care, Other (non HMO) | Admitting: Family Medicine

## 2014-08-15 VITALS — BP 108/66 | HR 78 | Ht 63.0 in | Wt 127.0 lb

## 2014-08-15 DIAGNOSIS — F411 Generalized anxiety disorder: Secondary | ICD-10-CM

## 2014-08-15 DIAGNOSIS — Z0000 Encounter for general adult medical examination without abnormal findings: Secondary | ICD-10-CM

## 2014-08-15 DIAGNOSIS — Z01419 Encounter for gynecological examination (general) (routine) without abnormal findings: Secondary | ICD-10-CM

## 2014-08-15 DIAGNOSIS — Z1231 Encounter for screening mammogram for malignant neoplasm of breast: Secondary | ICD-10-CM

## 2014-08-15 DIAGNOSIS — Z114 Encounter for screening for human immunodeficiency virus [HIV]: Secondary | ICD-10-CM

## 2014-08-15 DIAGNOSIS — Z23 Encounter for immunization: Secondary | ICD-10-CM

## 2014-08-15 DIAGNOSIS — F32A Depression, unspecified: Secondary | ICD-10-CM

## 2014-08-15 DIAGNOSIS — E039 Hypothyroidism, unspecified: Secondary | ICD-10-CM

## 2014-08-15 DIAGNOSIS — F329 Major depressive disorder, single episode, unspecified: Secondary | ICD-10-CM

## 2014-08-15 MED ORDER — FLUOXETINE HCL 10 MG PO CAPS: ORAL_CAPSULE | ORAL | Status: DC

## 2014-08-15 MED ORDER — ALPRAZOLAM 0.5 MG PO TABS: 0.5000 mg | ORAL_TABLET | Freq: Every day | ORAL | Status: DC | PRN

## 2014-08-15 NOTE — Progress Notes (Signed)
Subjective:     Brenda Sutton is a 44 y.o. female and is here for a comprehensive physical exam. The patient reports no problems. She has been fairly stressed and overwhelmed lately. She is thinking about restarting her fluoxetine. She's been on it off and on. She has noticed a decreased appetite for several months in fact has lost about 8 pounds. She's not exercising and says she has not purposely lost the weight but has also not been eating very regularly. Though when she does eat she feels like it's more healthy. Does report feeling down and depressed more than half the days. Also reports feeling nervous and on edge more than half the days. She denies any thoughts of wanting to harm herself that she has had thoughts of feeling better off dead. She rates her symptoms is somewhat difficult.  History   Social History  . Marital Status: Single    Spouse Name: N/A    Number of Children: N/A  . Years of Education: N/A   Occupational History  . Not on file.   Social History Main Topics  . Smoking status: Never Smoker   . Smokeless tobacco: Not on file  . Alcohol Use: 1.0 oz/week    2 drink(s) per week  . Drug Use: Yes    Special: Marijuana  . Sexual Activity: Not on file   Other Topics Concern  . Not on file   Social History Narrative   No regular exercise.    Health Maintenance  Topic Date Due  . HIV Screening  11/04/1987  . INFLUENZA VACCINE  02/04/2014  . PAP SMEAR  03/29/2014  . TETANUS/TDAP  03/16/2022    The following portions of the patient's history were reviewed and updated as appropriate: allergies, current medications, past family history, past medical history, past social history, past surgical history and problem list.  Review of Systems A comprehensive review of systems was negative.   Objective:    BP 108/66 mmHg  Pulse 78  Ht 5' 3"  (1.6 m)  Wt 127 lb (57.607 kg)  BMI 22.50 kg/m2 General appearance: alert, cooperative and appears stated age Head:  Normocephalic, without obvious abnormality, atraumatic Eyes: conjc clear, EOM, PEERLA Ears: normal TM's and external ear canals both ears Nose: Nares normal. Septum midline. Mucosa normal. No drainage or sinus tenderness. Throat: lips, mucosa, and tongue normal; teeth and gums normal Neck: no adenopathy, no carotid bruit, no JVD, supple, symmetrical, trachea midline and thyroid not enlarged, symmetric, no tenderness/mass/nodules Back: symmetric, no curvature. ROM normal. No CVA tenderness. Lungs: clear to auscultation bilaterally Breasts: normal appearance, no masses or tenderness Heart: regular rate and rhythm, S1, S2 normal, no murmur, click, rub or gallop Abdomen: soft, non-tender; bowel sounds normal; no masses,  no organomegaly Extremities: extremities normal, atraumatic, no cyanosis or edema Pulses: 2+ and symmetric Skin: Skin color, texture, turgor normal. No rashes or lesions Lymph nodes: Cervical, supraclavicular, and axillary nodes normal. Neurologic: Alert and oriented X 3, normal strength and tone. Normal symmetric reflexes. Normal coordination and gait    Assessment:    Healthy female exam.      Plan:     See After Visit Summary for Counseling Recommendations  Keep up a regular exercise program and make sure you are eating a healthy diet Try to eat 4 servings of dairy a day, or if you are lactose intolerant take a calcium with vitamin D daily.  Your vaccines are up to date.   Depression/Anxiety, moderate - will restart  fluoxetine.  PHQ 9 score of 11 today and Gad 7 score of 8 Follow-up in 6 months. We can continue to taper back up to 40 mg which was her previous dose. If she's not tolerating it well, having side effects, or not getting symptomatic relief then please follow-up sooner.  Due for pap. She says will schedule w/ her GYN.

## 2014-08-15 NOTE — Patient Instructions (Signed)
Keep up a regular exercise program and make sure you are eating a healthy diet Try to eat 4 servings of dairy a day, or if you are lactose intolerant take a calcium with vitamin D daily.  Your vaccines are up to date.

## 2014-08-16 LAB — COMPLETE METABOLIC PANEL WITH GFR
ALBUMIN: 4.9 g/dL (ref 3.5–5.2)
ALT: 11 U/L (ref 0–35)
AST: 13 U/L (ref 0–37)
Alkaline Phosphatase: 37 U/L — ABNORMAL LOW (ref 39–117)
BUN: 14 mg/dL (ref 6–23)
CO2: 26 mEq/L (ref 19–32)
Calcium: 9.5 mg/dL (ref 8.4–10.5)
Chloride: 103 mEq/L (ref 96–112)
Creat: 0.66 mg/dL (ref 0.50–1.10)
GFR, Est African American: >89 mL/min
GFR, Est Non African American: >89 mL/min
Glucose, Bld: 81 mg/dL (ref 70–99)
POTASSIUM: 4.3 meq/L (ref 3.5–5.3)
Sodium: 138 mEq/L (ref 135–145)
Total Bilirubin: 0.8 mg/dL (ref 0.2–1.2)
Total Protein: 7.3 g/dL (ref 6.0–8.3)

## 2014-08-16 LAB — TSH: TSH: 2.544 u[IU]/mL (ref 0.350–4.500)

## 2014-08-16 LAB — LIPID PANEL
Cholesterol: 157 mg/dL (ref 0–200)
HDL: 73 mg/dL (ref >39)
LDL Cholesterol: 77 mg/dL (ref 0–99)
Total CHOL/HDL Ratio: 2.2 Ratio
Triglycerides: 34 mg/dL (ref <150)
VLDL: 7 mg/dL (ref 0–40)

## 2014-08-16 LAB — HIV ANTIBODY (ROUTINE TESTING W REFLEX): HIV: NONREACTIVE

## 2014-10-02 ENCOUNTER — Telehealth: Payer: Self-pay | Admitting: *Deleted

## 2014-10-02 MED ORDER — FLUOXETINE HCL 20 MG PO CAPS: 20.0000 mg | ORAL_CAPSULE | Freq: Every day | ORAL | Status: DC

## 2014-10-02 NOTE — Telephone Encounter (Signed)
Script sent  

## 2014-10-03 ENCOUNTER — Other Ambulatory Visit: Payer: Self-pay | Admitting: *Deleted

## 2014-10-03 MED ORDER — ALPRAZOLAM 0.5 MG PO TABS: 0.5000 mg | ORAL_TABLET | Freq: Every day | ORAL | Status: DC | PRN

## 2015-08-22 ENCOUNTER — Other Ambulatory Visit: Payer: Self-pay | Admitting: Family Medicine

## 2015-08-22 ENCOUNTER — Telehealth: Payer: Self-pay | Admitting: Family Medicine

## 2015-08-22 NOTE — Telephone Encounter (Signed)
I called this patient and scheduled her med f/u for 08-28-15 with Dr. Madilyn Fireman

## 2015-08-28 ENCOUNTER — Ambulatory Visit: Payer: Managed Care, Other (non HMO) | Admitting: Family Medicine

## 2015-12-09 ENCOUNTER — Other Ambulatory Visit: Payer: Self-pay | Admitting: Family Medicine

## 2015-12-11 ENCOUNTER — Other Ambulatory Visit: Payer: Self-pay | Admitting: Family Medicine

## 2016-01-04 ENCOUNTER — Encounter: Payer: Self-pay | Admitting: Family Medicine

## 2016-01-04 ENCOUNTER — Ambulatory Visit (INDEPENDENT_AMBULATORY_CARE_PROVIDER_SITE_OTHER): Payer: Managed Care, Other (non HMO) | Admitting: Family Medicine

## 2016-01-04 VITALS — BP 95/50 | HR 60 | Wt 147.0 lb

## 2016-01-04 DIAGNOSIS — E039 Hypothyroidism, unspecified: Secondary | ICD-10-CM | POA: Diagnosis not present

## 2016-01-04 DIAGNOSIS — Z0000 Encounter for general adult medical examination without abnormal findings: Secondary | ICD-10-CM

## 2016-01-04 DIAGNOSIS — F329 Major depressive disorder, single episode, unspecified: Secondary | ICD-10-CM | POA: Diagnosis not present

## 2016-01-04 DIAGNOSIS — Z13220 Encounter for screening for lipoid disorders: Secondary | ICD-10-CM | POA: Diagnosis not present

## 2016-01-04 DIAGNOSIS — F411 Generalized anxiety disorder: Secondary | ICD-10-CM | POA: Diagnosis not present

## 2016-01-04 DIAGNOSIS — F32A Depression, unspecified: Secondary | ICD-10-CM

## 2016-01-04 MED ORDER — FLUOXETINE HCL 20 MG PO CAPS: ORAL_CAPSULE | ORAL | Status: DC

## 2016-01-04 NOTE — Progress Notes (Signed)
Subjective:    CC: Hypothyroidism -   HPI: Hypothyroidism she has not taken her levothyroxine iin several months.     Anxiety/depression-overall she is doing really well. She is happy with her medication and feels like her symptoms are very manageable. She does complain of feeling down several days of the week and feeling bad about herself more than half the days. She does complain about feeling nervous and anxious. She rates her symptoms is somewhat difficult.   Past medical history, Surgical history, Family history not pertinant except as noted below, Social history, Allergies, and medications have been entered into the medical record, reviewed, and corrections made.   Review of Systems: No fevers, chills, night sweats, weight loss, chest pain, or shortness of breath.   Objective:    General: Well Developed, well nourished, and in no acute distress.  Neuro: Alert and oriented x3, extra-ocular muscles intact, sensation grossly intact.  HEENT: Normocephalic, atraumatic  Skin: Warm and dry, no rashes. Cardiac: Regular rate and rhythm, no murmurs rubs or gallops, no lower extremity edema.  Respiratory: Clear to auscultation bilaterally. Not using accessory muscles, speaking in full sentences.   Impression and Recommendations:   Hypothyroidism-she wants to check her level off of thyroid medication since she only takes 25 g. She's been off of it for couple of months now so we'll see where her baseline is and see if we need to restart it or not. I did not refill that medication today.  Anxiety/depression-PHQ 9 score of 6 and GAD 7 score of 6. Continue current regimen of fluoxetine 20 mg. She declined any adjustments today.

## 2016-01-08 LAB — LIPID PANEL
Cholesterol: 144 mg/dL (ref 125–200)
HDL: 69 mg/dL (ref >=46)
LDL Cholesterol: 66 mg/dL (ref <130)
Total CHOL/HDL Ratio: 2.1 Ratio (ref <=5.0)
Triglycerides: 45 mg/dL (ref <150)
VLDL: 9 mg/dL (ref <30)

## 2016-01-08 LAB — COMPLETE METABOLIC PANEL WITH GFR
ALT: 16 U/L (ref 6–29)
AST: 19 U/L (ref 10–30)
Albumin: 4 g/dL (ref 3.6–5.1)
Alkaline Phosphatase: 39 U/L (ref 33–115)
BUN: 15 mg/dL (ref 7–25)
CHLORIDE: 105 mmol/L (ref 98–110)
CO2: 26 mmol/L (ref 20–31)
Calcium: 9 mg/dL (ref 8.6–10.2)
Creat: 0.69 mg/dL (ref 0.50–1.10)
GFR, Est African American: >89 mL/min (ref >=60)
GFR, Est Non African American: >89 mL/min (ref >=60)
GLUCOSE: 78 mg/dL (ref 65–99)
POTASSIUM: 4.2 mmol/L (ref 3.5–5.3)
SODIUM: 138 mmol/L (ref 135–146)
Total Bilirubin: 0.8 mg/dL (ref 0.2–1.2)
Total Protein: 6.1 g/dL (ref 6.1–8.1)

## 2016-01-08 LAB — TSH: TSH: 3.86 m[IU]/L

## 2016-01-11 ENCOUNTER — Other Ambulatory Visit (HOSPITAL_COMMUNITY)
Admission: RE | Admit: 2016-01-11 | Discharge: 2016-01-11 | Disposition: A | Discharge status: Home / Self Care | Payer: Managed Care, Other (non HMO) | Source: Ambulatory Visit | Attending: Family Medicine | Admitting: Family Medicine

## 2016-01-11 ENCOUNTER — Ambulatory Visit (INDEPENDENT_AMBULATORY_CARE_PROVIDER_SITE_OTHER): Payer: Managed Care, Other (non HMO) | Admitting: Family Medicine

## 2016-01-11 ENCOUNTER — Encounter: Payer: Self-pay | Admitting: Family Medicine

## 2016-01-11 VITALS — BP 98/51 | HR 68 | Wt 138.0 lb

## 2016-01-11 DIAGNOSIS — Z01419 Encounter for gynecological examination (general) (routine) without abnormal findings: Secondary | ICD-10-CM | POA: Insufficient documentation

## 2016-01-11 DIAGNOSIS — Z0000 Encounter for general adult medical examination without abnormal findings: Secondary | ICD-10-CM | POA: Insufficient documentation

## 2016-01-11 DIAGNOSIS — Z1231 Encounter for screening mammogram for malignant neoplasm of breast: Secondary | ICD-10-CM | POA: Diagnosis not present

## 2016-01-11 DIAGNOSIS — Z1151 Encounter for screening for human papillomavirus (HPV): Secondary | ICD-10-CM | POA: Diagnosis present

## 2016-01-11 MED ORDER — ALPRAZOLAM 0.5 MG PO TABS: 0.5000 mg | ORAL_TABLET | Freq: Every day | ORAL | Status: AC | PRN

## 2016-01-11 NOTE — Patient Instructions (Signed)
Recheck thyroid in 6 months. Call for labs around that time.    Keep up a regular exercise program and make sure you are eating a healthy diet Try to eat 4 servings of dairy a day, or if you are lactose intolerant take a calcium with vitamin D daily.  Your vaccines are up to date.

## 2016-01-11 NOTE — Progress Notes (Signed)
Subjective:     Brenda Sutton is a 45 y.o. female and is here for a comprehensive physical exam. The patient reports no problems.She is doing really well overall. She's actually been running 3 times per week. Typically she'll run about 3 miles. She does get some muscle soreness and so occasionally will have massage therapy. She says she really benefits from this it really helps her muscular skeletal pain.   She would like a refill on her per alprazolam. She says she typically uses about 3 in an average month. Most time is to go to sleep.  Social History   Social History  . Marital Status: Single    Spouse Name: N/A  . Number of Children: N/A  . Years of Education: N/A   Occupational History  . Not on file.   Social History Main Topics  . Smoking status: Never Smoker   . Smokeless tobacco: Not on file  . Alcohol Use: 1.0 oz/week    2 drink(s) per week  . Drug Use: Yes    Special: Marijuana  . Sexual Activity: Not on file   Other Topics Concern  . Not on file   Social History Narrative   No regular exercise.    Health Maintenance  Topic Date Due  . PAP SMEAR  03/29/2014  . INFLUENZA VACCINE  02/05/2016  . TETANUS/TDAP  03/16/2022  . HIV Screening  Completed    The following portions of the patient's history were reviewed and updated as appropriate: allergies, current medications, past family history, past medical history, past social history, past surgical history and problem list.  Review of Systems A comprehensive review of systems was negative.   Objective:    BP 98/51 mmHg  Pulse 68  Wt 138 lb (62.596 kg)  SpO2 99% General appearance: alert, cooperative and appears stated age Head: Normocephalic, without obvious abnormality, atraumatic Eyes: conj claer, EOMI, PEERLA Ears: normal TM's and external ear canals both ears Nose: Nares normal. Septum midline. Mucosa normal. No drainage or sinus tenderness. Throat: lips, mucosa, and tongue normal; teeth and gums  normal Neck: no adenopathy, no carotid bruit, no JVD, supple, symmetrical, trachea midline and thyroid not enlarged, symmetric, no tenderness/mass/nodules Back: symmetric, no curvature. ROM normal. No CVA tenderness. Lungs: clear to auscultation bilaterally Breasts: normal appearance, no masses or tenderness Heart: regular rate and rhythm, S1, S2 normal, no murmur, click, rub or gallop Abdomen: soft, non-tender; bowel sounds normal; no masses,  no organomegaly Pelvic: cervix normal in appearance, external genitalia normal, no adnexal masses or tenderness, no cervical motion tenderness, rectovaginal septum normal, uterus normal size, shape, and consistency and vagina normal without discharge Extremities: extremities normal, atraumatic, no cyanosis or edema Pulses: 2+ and symmetric Skin: Skin color, texture, turgor normal. No rashes or lesions Lymph nodes: Cervical, supraclavicular, and axillary nodes normal. Neurologic: Alert and oriented X 3, normal strength and tone. Normal symmetric reflexes. Normal coordination and gait    Assessment:    Healthy female exam.     Plan:     See After Visit Summary for Counseling Recommendations  Keep up a regular exercise program and make sure you are eating a healthy diet Try to eat 4 servings of dairy a day, or if you are lactose intolerant take a calcium with vitamin D daily.  Your vaccines are up to date.  Order mammogram.    Muscle pain-really benefits from massage therapy. Encouraged her to continue this. I think it's a great weight improved pain relief stress.  Anxiety-did refill her alprazolam to use as needed. Again reminded her to make sure that she is using it sparingly and reminded her of the pen potential for addiction

## 2016-01-14 ENCOUNTER — Telehealth: Payer: Self-pay | Admitting: Family Medicine

## 2016-01-14 NOTE — Telephone Encounter (Signed)
Please call patient and let her know I did some additional research. I was able to find some information about studies showing that patients did improve with her allergy symptoms then were able to reduce medication with immunotherapy. There was a statistical significance between immunotherapy and there is on placebo.I was not able to find any specific number of what percentage of people respond and over what period of time. Certainly if she wanted to do a consult with an allergist and see if they have a little bit more additional information and can give her better idea of length of treatment and expected response rate we can arrange that.

## 2016-01-15 LAB — CYTOLOGY - PAP

## 2016-01-16 NOTE — Progress Notes (Signed)
Quick Note:  Call patient: Your Pap smear is normal. Repeat in 5 years. ______

## 2016-01-16 NOTE — Telephone Encounter (Signed)
Pt informed of recommendations.Brenda Sutton [place]

## 2016-02-01 ENCOUNTER — Ambulatory Visit (INDEPENDENT_AMBULATORY_CARE_PROVIDER_SITE_OTHER): Payer: Managed Care, Other (non HMO)

## 2016-02-01 DIAGNOSIS — Z1231 Encounter for screening mammogram for malignant neoplasm of breast: Secondary | ICD-10-CM | POA: Diagnosis not present

## 2016-08-07 ENCOUNTER — Encounter: Payer: Self-pay | Admitting: Family Medicine

## 2016-08-07 ENCOUNTER — Ambulatory Visit (INDEPENDENT_AMBULATORY_CARE_PROVIDER_SITE_OTHER): Payer: Managed Care, Other (non HMO) | Admitting: Family Medicine

## 2016-08-07 VITALS — BP 110/52 | HR 71 | Ht 63.0 in | Wt 141.0 lb

## 2016-08-07 DIAGNOSIS — R1031 Right lower quadrant pain: Secondary | ICD-10-CM | POA: Diagnosis not present

## 2016-08-07 NOTE — Progress Notes (Signed)
Subjective:    Patient ID: Brenda Sutton, female    DOB: 1971/02/02, 46 y.o.   MRN: [id number]  HPI 46 yo female pt reports right pelvic pain on and off for years.  The  pain became worse 3 wks ago. She says it is a pain approximately in a small area 3-4 cm over the right groin crease. It always occurs in the same location. It never radiates. She says it can occur multiple times within an hour and then she may go weeks without noticing the pain at all.. she denies any injury/trauma. the pain is sharp and lasts for about 30sec-2 mins she thought at one time it was correlated with her menstrual cycle but now doesn't feel that this is the case.  He is concerned because her father had lymphoma. Though she denies any swollen lymph nodes. He denies any relationship to eating or bowel movements. She said a couple of times it seemed to correlate around the time she was having her menses but not every time. Movement and position changes doesn't affect it.     Her pelvic and Pap smear are up-to-date this was done in July, approximately 6 months ago. BP (!) 110/52   Pulse 71   Ht 5' 3"  (1.6 m)   Wt 141 lb (64 kg)   SpO2 100%   BMI 24.98 kg/m     Allergies  Allergen Reactions  . Erythromycin Nausea And Vomiting    Past Medical History:  Diagnosis Date  . Suicide attempt    at age 5    Past Surgical History:  Procedure Laterality Date  . TONSILLECTOMY      Social History   Social History  . Marital status: Single    Spouse name: N/A  . Number of children: N/A  . Years of education: N/A   Occupational History  . Not on file.   Social History Main Topics  . Smoking status: Never Smoker  . Smokeless tobacco: Never Used  . Alcohol use 1.0 oz/week    2 Standard drinks or equivalent per week  . Drug use: Yes    Types: Marijuana  . Sexual activity: Not on file   Other Topics Concern  . Not on file   Social History Narrative   No regular exercise.     Family History   Problem Relation Age of Onset  . Lymphoma Father     Non hodgkins   . Depression      Outpatient Encounter Prescriptions as of 08/07/2016  Medication Sig  . FLUoxetine (PROZAC) 20 MG capsule TAKE 1 CAPSULE BY MOUTH DAIL  . fluticasone (FLONASE) 50 MCG/ACT nasal spray Place into both nostrils daily.  . [DISCONTINUED] levothyroxine (SYNTHROID, LEVOTHROID) 25 MCG tablet TAKE 1 TABLET (25 MCG TOTAL) BY MOUTH DAILY BEFORE BREAKFAST.   No facility-administered encounter medications on file as of 08/07/2016.        Review of Systems     Objective:   Physical Exam  Constitutional: She is oriented to person, place, and time. She appears well-developed and well-nourished.  HENT:  Head: Normocephalic and atraumatic.  Eyes: Conjunctivae and EOM are normal.  Cardiovascular: Normal rate.   Pulmonary/Chest: Effort normal.  Abdominal: Soft. Bowel sounds are normal. She exhibits no distension and no mass. There is no tenderness. There is no rebound and no guarding.  Musculoskeletal:  Right hip with normal flexion extension abduction and abduction against resistance. This did not recreate any discomfort or pain. Right along the  right groin crease she does have some small mobile palpable lymph nodes that they do not feel hard and they are not greater than a centimeter and they are mobile. She does not have any tenderness on exam.  Neurological: She is alert and oriented to person, place, and time.  Skin: Skin is dry. No pallor.  Psychiatric: She has a normal mood and affect. Her behavior is normal.  Vitals reviewed.         Assessment & Plan:  Right groin crease pain-unclear etiology at this point. Unable to reduce her pain on exam. Thus musculoskeletal such as flexor tendon strain or tear is very unlikely. I did not palpate any type of swelling or herniation or defect in the wall. She does have some small lymph nodes but they are reassuring in their characteristics. There are will plan to get  an ultrasound as well as a CBC.

## 2016-08-27 ENCOUNTER — Ambulatory Visit (INDEPENDENT_AMBULATORY_CARE_PROVIDER_SITE_OTHER): Payer: Managed Care, Other (non HMO)

## 2016-08-27 ENCOUNTER — Other Ambulatory Visit: Payer: Managed Care, Other (non HMO)

## 2016-08-27 DIAGNOSIS — R1031 Right lower quadrant pain: Secondary | ICD-10-CM

## 2016-08-27 DIAGNOSIS — R590 Localized enlarged lymph nodes: Secondary | ICD-10-CM

## 2016-08-27 DIAGNOSIS — N838 Other noninflammatory disorders of ovary, fallopian tube and broad ligament: Secondary | ICD-10-CM

## 2016-08-28 ENCOUNTER — Telehealth: Payer: Self-pay | Admitting: *Deleted

## 2016-08-28 DIAGNOSIS — N83201 Unspecified ovarian cyst, right side: Secondary | ICD-10-CM

## 2016-09-03 NOTE — Telephone Encounter (Signed)
Pt informed.Brenda Sutton, [place]

## 2016-09-04 LAB — CBC WITH DIFFERENTIAL/PLATELET
Basophils Absolute: 0 cells/uL (ref 0–200)
Basophils Relative: 0 %
EOS PCT: 2 %
Eosinophils Absolute: 100 cells/uL (ref 15–500)
HCT: 35.9 % (ref 35.0–45.0)
HEMOGLOBIN: 12.2 g/dL (ref 11.7–15.5)
LYMPHS ABS: 1500 {cells}/uL (ref 850–3900)
Lymphocytes Relative: 30 %
MCH: 31 pg (ref 27.0–33.0)
MCHC: 34 g/dL (ref 32.0–36.0)
MCV: 91.3 fL (ref 80.0–100.0)
MPV: 9.4 fL (ref 7.5–12.5)
Monocytes Absolute: 350 cells/uL (ref 200–950)
Monocytes Relative: 7 %
NEUTROS ABS: 3050 {cells}/uL (ref 1500–7800)
NEUTROS PCT: 61 %
PLATELETS: 385 10*3/uL (ref 140–400)
RBC: 3.93 MIL/uL (ref 3.80–5.10)
RDW: 13.8 % (ref 11.0–15.0)
WBC: 5 10*3/uL (ref 3.8–10.8)

## 2016-12-08 ENCOUNTER — Other Ambulatory Visit: Payer: Self-pay | Admitting: Family Medicine

## 2017-03-11 ENCOUNTER — Other Ambulatory Visit: Payer: Self-pay | Admitting: Family Medicine

## 2017-05-06 ENCOUNTER — Encounter: Payer: Self-pay | Admitting: Family Medicine

## 2017-05-06 ENCOUNTER — Other Ambulatory Visit: Payer: Self-pay | Admitting: Family Medicine

## 2017-05-06 ENCOUNTER — Ambulatory Visit (INDEPENDENT_AMBULATORY_CARE_PROVIDER_SITE_OTHER): Payer: 59 | Admitting: Family Medicine

## 2017-05-06 VITALS — BP 97/56 | HR 84

## 2017-05-06 DIAGNOSIS — N3000 Acute cystitis without hematuria: Secondary | ICD-10-CM

## 2017-05-06 DIAGNOSIS — N83201 Unspecified ovarian cyst, right side: Secondary | ICD-10-CM

## 2017-05-06 DIAGNOSIS — N83209 Unspecified ovarian cyst, unspecified side: Secondary | ICD-10-CM

## 2017-05-06 DIAGNOSIS — R300 Dysuria: Secondary | ICD-10-CM | POA: Diagnosis not present

## 2017-05-06 LAB — POCT URINALYSIS DIPSTICK
BILIRUBIN UA: NEGATIVE
GLUCOSE UA: NEGATIVE
KETONES UA: NEGATIVE
Nitrite, UA: NEGATIVE
PH UA: 7 (ref 5.0–8.0)
SPEC GRAV UA: 1.015 (ref 1.010–1.025)
UROBILINOGEN UA: 0.2 U/dL

## 2017-05-06 MED ORDER — CIPROFLOXACIN HCL 500 MG PO TABS: 500.0000 mg | ORAL_TABLET | Freq: Two times a day (BID) | ORAL | 0 refills | Status: AC

## 2017-05-06 NOTE — Patient Instructions (Addendum)

## 2017-05-06 NOTE — Progress Notes (Signed)
Subjective:    Patient ID: Brenda Sutton, female    DOB: 10/02/1970, 46 y.o.   MRN: [id number]  HPI 46 year old female comes in today complaining of some  RLQ  pelvic discomfort.  She had very similar symptoms previously back in January and February of last year.  It eventually got better but says it returned about 3-4 weeks ago.  Actually did a pelvic ultrasound in February.  She had a cyst on her right ovary at they did recommend follow-up on.  She is also had some dysuria and frequency for about 2 weeks as well.  Has been trying to drink more water.    She denies any fevers, chills, sweats.  She denies any pain with intercourse.  No abnormal vaginal discharge.    US Transvaginal Non-OB (Accession [id number]) (Order [id number])  Imaging  Date: 08/27/2016 Department: [facility] Released By: Starleen Blue Authorizing: Hali Marry, MD  Exam Information   Status Exam Begun  Exam Ended   Final [99] 08/27/2016 11:34 AM 08/27/2016 11:45 AM  PACS Images   Show images for US Transvaginal Non-OB  Study Result   CLINICAL DATA:  Initial evaluation for intermittent pelvic/ right groin pain for 2 years.  EXAM: TRANSABDOMINAL AND TRANSVAGINAL ULTRASOUND OF PELVIS  TECHNIQUE: Both transabdominal and transvaginal ultrasound examinations of the pelvis were performed. Transabdominal technique was performed for global imaging of the pelvis including uterus, ovaries, adnexal regions, and pelvic cul-de-sac. It was necessary to proceed with endovaginal exam following the transabdominal exam to visualize the uterus and ovaries.  COMPARISON:  None  FINDINGS: Uterus  Measurements: 7.4 x 3.2 x 4.6 cm. No fibroids or other mass visualized.  Endometrium  Thickness: 5 mm.  No focal abnormality visualized.  Right ovary  Measurements: 4.0 x 1.7 x 2.5 cm. 2.0 x 1.2 x 1.1 cm mildly complex hypoechoic lesion with fairly homogeneous  low internal echoes. No internal vascularity or significant solid component identified.  Left ovary  Measurements: 3.1 x 1.8 x 3.1 cm. Normal appearance/no adnexal mass. Few normal appearing follicles noted.  Other findings  1.1 cm right inguinal lymph noted at site of right groin pain. Lymph node itself is fairly normal in appearance with normal architecture and fatty hilum.  IMPRESSION: 1. 2.0 x 1.2 x 1.1 cm hypoechoic right ovarian lesion, indeterminate, but may reflect a complex cyst with internal debris and/or hemorrhage. A follow-up ultrasound in 6-12 weeks to ensure resolution is suggested. 2. 1.1 cm right inguinal lymph node at site of pain in the right groin. Finding is indeterminate, but may be reactive. Correlation with physical exam recommended. Overall, the lymph node itself is fairly normal in appearance with normal echotexture and internal fatty hilum. No concerning sonographic features identified. This could also be assessed at follow-up. 3. Otherwise unremarkable pelvic ultrasound.   Electronically Signed   By: Jeannine Boga M.D.   On: 08/27/2016 12:50     Review of Systems     Objective:   Physical Exam  Constitutional: She is oriented to person, place, and time. She appears well-developed and well-nourished.  HENT:  Head: Normocephalic and atraumatic.  Cardiovascular: Normal rate, regular rhythm and normal heart sounds.   Pulmonary/Chest: Effort normal and breath sounds normal.  Abdominal: Soft. Bowel sounds are normal. She exhibits no distension and no mass. There is no tenderness. There is no rebound and no guarding.  Neurological: She is alert and oriented to person, place, and time.  Skin: Skin  is warm and dry.  Psychiatric: She has a normal mood and affect. Her behavior is normal.        Assessment & Plan:  Right ovarian cyst-need to schedule repeat ultrasound.  He still needs a repeat ultrasound to follow-up on the  indeterminate cyst.  Pleasants her right-sided pelvic pain is back I think it is important that we follow this up.  She also had a slightly enlarged lymph node in that right groin area and I want to follow-up on that as well.  UTI - will tx with Cipro.  Continue to work on Designer, fashion/clothing.  Call if symptoms not resolved after 3 days.  Additional information handout provided.

## 2017-05-08 ENCOUNTER — Other Ambulatory Visit: Payer: 59

## 2017-05-12 ENCOUNTER — Ambulatory Visit (INDEPENDENT_AMBULATORY_CARE_PROVIDER_SITE_OTHER): Payer: 59

## 2017-05-12 DIAGNOSIS — N83209 Unspecified ovarian cyst, unspecified side: Secondary | ICD-10-CM

## 2017-05-12 DIAGNOSIS — N83201 Unspecified ovarian cyst, right side: Secondary | ICD-10-CM

## 2017-06-09 ENCOUNTER — Ambulatory Visit (INDEPENDENT_AMBULATORY_CARE_PROVIDER_SITE_OTHER): Payer: 59 | Admitting: Physician Assistant

## 2017-06-09 ENCOUNTER — Encounter: Payer: Self-pay | Admitting: Physician Assistant

## 2017-06-09 VITALS — BP 103/61 | HR 73 | Temp 97.9°F | Ht 63.0 in | Wt 136.0 lb

## 2017-06-09 DIAGNOSIS — N3001 Acute cystitis with hematuria: Secondary | ICD-10-CM | POA: Diagnosis not present

## 2017-06-09 DIAGNOSIS — R300 Dysuria: Secondary | ICD-10-CM

## 2017-06-09 LAB — POCT URINALYSIS DIPSTICK
Bilirubin, UA: NEGATIVE
Glucose, UA: NEGATIVE
Ketones, UA: NEGATIVE
NITRITE UA: POSITIVE
PH UA: 7 (ref 5.0–8.0)
Protein, UA: NEGATIVE
SPEC GRAV UA: 1.015 (ref 1.010–1.025)
Urobilinogen, UA: 1 E.U./dL

## 2017-06-09 MED ORDER — CIPROFLOXACIN HCL 500 MG PO TABS: 500.0000 mg | ORAL_TABLET | Freq: Two times a day (BID) | ORAL | 0 refills | Status: DC

## 2017-06-09 MED ORDER — CIPROFLOXACIN HCL 500 MG PO TABS
500.0000 mg | ORAL_TABLET | Freq: Two times a day (BID) | ORAL | 0 refills | Status: DC
Start: 2017-06-09 — End: 2017-07-20

## 2017-06-09 NOTE — Patient Instructions (Signed)

## 2017-06-09 NOTE — Progress Notes (Addendum)
Subjective:    Patient ID: Brenda Sutton, female    DOB: [birth year], 46 y.o.   MRN: [id number]  HPI Brenda Sutton is a 46 y/o female who presents for dysuria x1 day.  She also reports hematuria, frequency, and urgency. She denies fever, chills, diarrhea, nausea or vomiting. Patient has been increasing her water intake. She took Azo yesterday and experienced significant relief of symptoms. She has history of UTIs and reports the last UTI was 1 month ago. She was treated with Cipro BID x3 days and experienced complete relief of symptoms.  .. Active Ambulatory Problems    Diagnosis Date Noted  . GAD (generalized anxiety disorder) 11/03/2006  . Depression 07/30/2007  . BENIGN POSITIONAL VERTIGO 10/27/2008  . Hypothyroidism 05/16/2013  . Metatarsophalangeal (joint) sprain 10/27/2013  . Routine general medical examination at a health care facility 01/11/2016   Resolved Ambulatory Problems    Diagnosis Date Noted  . SINUSITIS, ACUTE NOS 06/22/2006   Past Medical History:  Diagnosis Date  . Suicide attempt [facility])       Review of Systems  Constitutional: Negative for chills and fever.  Gastrointestinal: Negative for diarrhea, nausea and vomiting.  Genitourinary: Positive for dysuria, frequency, hematuria and urgency. Negative for flank pain.       Objective:   Physical Exam  Constitutional: She is oriented to person, place, and time. She appears well-developed and well-nourished.  HENT:  Head: Normocephalic and atraumatic.  Cardiovascular: Normal rate, regular rhythm and normal heart sounds.  Pulmonary/Chest: Effort normal and breath sounds normal.  Abdominal: Soft. There is tenderness in the suprapubic area. There is CVA tenderness (mild right sided). There is no rigidity, no rebound and no guarding.  Neurological: She is alert and oriented to person, place, and time.  Skin: Skin is warm and dry.  Psychiatric: She has a normal mood and affect. Her behavior is normal. Thought  content normal.  Vitals reviewed.     Assessment & Plan:  Marland KitchenMarland KitchenAnda Sutton was seen today for dysuria and flank pain.  Diagnoses and all orders for this visit:  Acute cystitis with hematuria -     ciprofloxacin (CIPRO) 500 MG tablet; Take 1 tablet (500 mg total) by mouth 2 (two) times daily. For 7 days.  Dysuria -     POCT urinalysis dipstick -     Urine Culture  Other orders -     Discontinue: ciprofloxacin (CIPRO) 500 MG tablet; Take 1 tablet (500 mg total) by mouth 2 (two) times daily. For 3 day.   - Due to the mild CVA tenderness, presence of leukocytes, nitrates, and blood on UA, and her recent UTI, I am suspicious for pyelonephritis and will treat her with a 7 day course of cipro. Afebrile today. Will culture urine. Encouraged patient to increase water intake and continue to take Azo for another 2 days for symptomatic relief. Patient will follow-up in 8-10 days for nurse visit to recheck urine and confirm resolution of infection.  .. Results for orders placed or performed in visit on 06/09/17  POCT urinalysis dipstick  Result Value Ref Range   Color, UA orange    Clarity, UA slightly cloudy    Glucose, UA neg    Bilirubin, UA neg    Ketones, UA neg    Spec Grav, UA 1.015 1.010 - 1.025   Blood, UA moderate    pH, UA 7.0 5.0 - 8.0   Protein, UA neg    Urobilinogen, UA 1.0 0.2 or 1.0 E.U./dL  Nitrite, UA pos    Leukocytes, UA Moderate (2+) (A) Negative

## 2017-06-12 LAB — URINE CULTURE
MICRO NUMBER:: 81363072
SPECIMEN QUALITY: ADEQUATE

## 2017-06-17 ENCOUNTER — Other Ambulatory Visit: Payer: Self-pay | Admitting: Family Medicine

## 2017-07-14 ENCOUNTER — Other Ambulatory Visit: Payer: Self-pay | Admitting: Family Medicine

## 2017-07-14 DIAGNOSIS — N858 Other specified noninflammatory disorders of uterus: Secondary | ICD-10-CM

## 2017-07-14 NOTE — Progress Notes (Signed)
Per Pt request, follow up US ordered.

## 2017-07-17 ENCOUNTER — Ambulatory Visit (INDEPENDENT_AMBULATORY_CARE_PROVIDER_SITE_OTHER): Payer: 59

## 2017-07-17 DIAGNOSIS — N858 Other specified noninflammatory disorders of uterus: Secondary | ICD-10-CM | POA: Diagnosis not present

## 2017-07-17 DIAGNOSIS — R1031 Right lower quadrant pain: Secondary | ICD-10-CM

## 2017-07-20 ENCOUNTER — Other Ambulatory Visit: Payer: Self-pay | Admitting: *Deleted

## 2017-07-20 DIAGNOSIS — N858 Other specified noninflammatory disorders of uterus: Secondary | ICD-10-CM

## 2017-07-20 DIAGNOSIS — N83209 Unspecified ovarian cyst, unspecified side: Secondary | ICD-10-CM

## 2017-07-20 NOTE — Progress Notes (Signed)
Fwd to pcp for review to be sure that this is the correct order.Brenda Sutton'

## 2017-08-05 ENCOUNTER — Inpatient Hospital Stay: Payer: 59 | Attending: Gynecologic Oncology | Admitting: Gynecologic Oncology

## 2017-08-05 ENCOUNTER — Encounter: Payer: Self-pay | Admitting: Gynecologic Oncology

## 2017-08-05 ENCOUNTER — Inpatient Hospital Stay: Payer: 59

## 2017-08-05 VITALS — BP 107/55 | HR 71 | Temp 97.9°F | Resp 18 | Ht 63.0 in | Wt 137.3 lb

## 2017-08-05 DIAGNOSIS — Z79899 Other long term (current) drug therapy: Secondary | ICD-10-CM | POA: Insufficient documentation

## 2017-08-05 DIAGNOSIS — R1031 Right lower quadrant pain: Secondary | ICD-10-CM | POA: Diagnosis not present

## 2017-08-05 DIAGNOSIS — Z87440 Personal history of urinary (tract) infections: Secondary | ICD-10-CM | POA: Diagnosis not present

## 2017-08-05 DIAGNOSIS — N83201 Unspecified ovarian cyst, right side: Secondary | ICD-10-CM | POA: Diagnosis present

## 2017-08-05 NOTE — H&P (View-Only) (Signed)
Consult Note: Gyn-Onc  Consult was requested by Dr. Ronney Asters for the evaluation of Brenda Sutton 47 y.o. female  CC:  Chief Complaint  Patient presents with  . Cyst of right ovary    Assessment/Plan:  [name]  is a 47 y.o.  year old with a complex right ovarian cyst and right lower quadrant pain.   While overall my suspicion for malignancy is low, because this mass is complex in appearance and symptomatic with pain, and because the patient overall has a relatively low surgical risk, I think surgical resection and definitive pathology is a reasonable course of action.  The patient agrees with this.  We have scheduled her for a robotic assisted right salpingo-oophorectomy possible hysterectomy, BSO, staging pending pathology results.  She understands that if this is benign she will retain her left ovary and uterus and her fertility.  She is agreed to a non-fertility preserving surgery should malignancy be found.  I discussed surgical risks including bleeding, infection, damage to adjacent structures, reoperation, VT E, and others.  I discussed anticipated outpatient status for this procedure and same-day discharge and anticipated recovery.  We will check a Ca1 25 today.  I discussed that if this is normal it is very reassuring, however if this is elevated it is nonspecific and may not indicate that there is an underlying malignancy.  HPI: Brenda Sutton is a 47 year old who was seen in consultation at the request of Dr Madilyn Fireman for a right adnexal mass.  The patient has been having intermittent right lower quadrant pain for approximately 1 year.  When it was initially identified she underwent a transvaginal ultrasound scan which identified an ovarian cyst on the right.  She is recommended follow-up however because her pain resolved temporarily she did not do this.  However in October 2018 the pain again resumed.  In November 2018 she was seen by her family medicine provider who  ordered a transvaginal ultrasound scan which again identified a right ovarian cystic mass.  It was repeated on July 17, 2017 and this time measured 3.8 x 2.7 x 3.4 cm and was more complex in appearance than the prior November study.  Her pain has continued and she now has some hip pain on the right.  She has had 1 prior vaginal delivery.  She has had no prior abdominal surgeries.  Her menstrual cycles are about every 17-22 days but she has no intermenstrual bleeding.  She has had multiple recent urinary tract infections.  Current Meds:  Outpatient Encounter Medications as of 08/05/2017  Medication Sig  . FLUoxetine (PROZAC) 20 MG capsule TAKE 1 CAPSULE BY MOUTH EVERY DAY  . fluticasone (FLONASE) 50 MCG/ACT nasal spray Place into both nostrils daily.   No facility-administered encounter medications on file as of 08/05/2017.     Allergy:  Allergies  Allergen Reactions  . Erythromycin Nausea And Vomiting    Social Hx:   Social History   Socioeconomic History  . Marital status: Single    Spouse name: Not on file  . Number of children: Not on file  . Years of education: Not on file  . Highest education level: Not on file  Social Needs  . Financial resource strain: Not on file  . Food insecurity - worry: Not on file  . Food insecurity - inability: Not on file  . Transportation needs - medical: Not on file  . Transportation needs - non-medical: Not on file  Occupational History  . Not  on file  Tobacco Use  . Smoking status: Never Smoker  . Smokeless tobacco: Never Used  Substance and Sexual Activity  . Alcohol use: Yes    Alcohol/week: 1.0 oz    Types: 2 Standard drinks or equivalent per week  . Drug use: Yes    Types: Marijuana  . Sexual activity: Not on file  Other Topics Concern  . Not on file  Social History Narrative   No regular exercise.     Past Surgical Hx:  Past Surgical History:  Procedure Laterality Date  . TONSILLECTOMY      Past Medical Hx:  Past  Medical History:  Diagnosis Date  . Suicide attempt ([place])    at age 47    Past Gynecological History:  SVD x 1 No LMP recorded.  Family Hx:  Family History  Problem Relation Age of Onset  . Lymphoma Father        Non hodgkins   . Depression Unknown     Review of Systems:  Constitutional  Feels well,   ENT Normal appearing ears and nares bilaterally Skin/Breast  No rash, sores, jaundice, itching, dryness Cardiovascular  No chest pain, shortness of breath, or edema  Pulmonary  No cough or wheeze.  Gastro Intestinal  No nausea, vomitting, or diarrhoea. No bright red blood per rectum, no abdominal pain, change in bowel movement, or constipation.  Genito Urinary  No frequency, urgency, dysuria, normal frequent menses Musculo Skeletal  No myalgia, arthralgia, joint swelling or pain  Neurologic  No weakness, numbness, change in gait,  Psychology  No depression, anxiety, insomnia.   Vitals:  Blood pressure (!) 107/55, pulse 71, temperature 97.9 F (36.6 C), temperature source Oral, resp. rate 18, height 5' 3"  (1.6 m), weight 137 lb 4.8 oz (62.3 kg), SpO2 99 %.  Physical Exam: WD in NAD Neck  Supple NROM, without any enlargements.  Lymph Node Survey No cervical supraclavicular or inguinal adenopathy Cardiovascular  Pulse normal rate, regularity and rhythm. S1 and S2 normal.  Lungs  Clear to auscultation bilateraly, without wheezes/crackles/rhonchi. Good air movement.  Skin  No rash/lesions/breakdown  Psychiatry  Alert and oriented to person, place, and time  Abdomen  Normoactive bowel sounds, abdomen soft, and thin without evidence of hernia. Tender in RLQ to deep palpation. Back No CVA tenderness Genito Urinary  Vulva/vagina: Normal external female genitalia.   No lesions. No discharge or bleeding.  Bladder/urethra:  No lesions or masses, well supported bladder  Vagina: normal  Cervix: Normal appearing, no lesions.  Uterus:  Small, mobile, no parametrial  involvement or nodularity.  Adnexa: fullness, cystic, smooth, appreciate in right LQ  Rectal  deferred Extremities  No bilateral cyanosis, clubbing or edema.   Donaciano Eva, MD  08/05/2017, 5:43 PM

## 2017-08-05 NOTE — Patient Instructions (Signed)
Plan on having a CA 125 drawn today.  We will notify you of the results.                Preparing for your Surgery  Plan on having a robotic assisted right salpingo-oophorectomy, possible bilateral salpingo-oophorectomy, possible total hysterectomy, possible staging.  Please call us with a date (potential options include Feb 12, Feb 14, Feb 21.  Pre-operative Testing -You will receive a phone call from presurgical testing at [facility] to arrange for a pre-operative testing appointment before your surgery.  This appointment normally occurs one to two weeks before your scheduled surgery.   -Bring your insurance card, copy of an advanced directive if applicable, medication list  -At that visit, you will be asked to sign a consent for a possible blood transfusion in case a transfusion becomes necessary during surgery.  The need for a blood transfusion is rare but having consent is a necessary part of your care.     -You should not be taking blood thinners or aspirin at least ten days prior to surgery unless instructed by your surgeon.  Day Before Surgery at [facility] will be asked to take in a light diet the day before surgery.  Avoid carbonated beverages.  You will be advised to have nothing to eat or drink after midnight the evening before.    Eat a light diet the day before surgery.  Examples including soups, broths, toast, yogurt, mashed potatoes.  Things to avoid include carbonated beverages (fizzy beverages), raw fruits and raw vegetables, or beans.   If your bowels are filled with gas, your surgeon will have difficulty visualizing your pelvic organs which increases your surgical risks.  Your role in recovery Your role is to become active as soon as directed by your doctor, while still giving yourself time to heal.  Rest when you feel tired. You will be asked to do the following in order to speed your recovery:  - Cough and breathe deeply. This helps toclear and expand your  lungs and can prevent pneumonia. You may be given a spirometer to practice deep breathing. A staff member will show you how to use the spirometer. - Do mild physical activity. Walking or moving your legs help your circulation and body functions return to normal. A staff member will help you when you try to walk and will provide you with simple exercises. Do not try to get up or walk alone the first time. - Actively manage your pain. Managing your pain lets you move in comfort. We will ask you to rate your pain on a scale of zero to 10. It is your responsibility to tell your doctor or nurse where and how much you hurt so your pain can be treated.  Special Considerations -If you are diabetic, you may be placed on insulin after surgery to have closer control over your blood sugars to promote healing and recovery.  This does not mean that you will be discharged on insulin.  If applicable, your oral antidiabetics will be resumed when you are tolerating a solid diet.  -Your final pathology results from surgery should be available by the Friday after surgery and the results will be relayed to you when available.  -Dr. Lahoma Crocker is the Surgeon that assists your GYN Oncologist with surgery.  The next day after your surgery you will either see your GYN Oncologist or Dr. Lahoma Crocker.   Blood Transfusion Information WHAT IS A BLOOD TRANSFUSION? A transfusion is  the replacement of blood or some of its parts. Blood is made up of multiple cells which provide different functions.  Red blood cells carry oxygen and are used for blood loss replacement.  White blood cells fight against infection.  Platelets control bleeding.  Plasma helps clot blood.  Other blood products are available for specialized needs, such as hemophilia or other clotting disorders. BEFORE THE TRANSFUSION  Who gives blood for transfusions?   You may be able to donate blood to be used at a later date on yourself  (autologous donation).  Relatives can be asked to donate blood. This is generally not any safer than if you have received blood from a stranger. The same precautions are taken to ensure safety when a relative's blood is donated.  Healthy volunteers who are fully evaluated to make sure their blood is safe. This is blood bank blood. Transfusion therapy is the safest it has ever been in the practice of medicine. Before blood is taken from a donor, a complete history is taken to make sure that person has no history of diseases nor engages in risky social behavior (examples are intravenous drug use or sexual activity with multiple partners). The donor's travel history is screened to minimize risk of transmitting infections, such as malaria. The donated blood is tested for signs of infectious diseases, such as HIV and hepatitis. The blood is then tested to be sure it is compatible with you in order to minimize the chance of a transfusion reaction. If you or a relative donates blood, this is often done in anticipation of surgery and is not appropriate for emergency situations. It takes many days to process the donated blood. RISKS AND COMPLICATIONS Although transfusion therapy is very safe and saves many lives, the main dangers of transfusion include:   Getting an infectious disease.  Developing a transfusion reaction. This is an allergic reaction to something in the blood you were given. Every precaution is taken to prevent this. The decision to have a blood transfusion has been considered carefully by your caregiver before blood is given. Blood is not given unless the benefits outweigh the risks.

## 2017-08-05 NOTE — Progress Notes (Signed)
Consult Note: Gyn-Onc  Consult was requested by Dr. Ronney Sutton for the evaluation of Brenda Sutton 47 y.o. female  CC:  Chief Complaint  Patient presents with  . Cyst of right ovary    Assessment/Plan:  [name]  is a 47 y.o.  year old with a complex right ovarian cyst and right lower quadrant pain.   While overall my suspicion for malignancy is low, because this mass is complex in appearance and symptomatic with pain, and because the patient overall has a relatively low surgical risk, I think surgical resection and definitive pathology is a reasonable course of action.  The patient agrees with this.  We have scheduled her for a robotic assisted right salpingo-oophorectomy possible hysterectomy, BSO, staging pending pathology results.  She understands that if this is benign she will retain her left ovary and uterus and her fertility.  She is agreed to a non-fertility preserving surgery should malignancy be found.  I discussed surgical risks including bleeding, infection, damage to adjacent structures, reoperation, VT E, and others.  I discussed anticipated outpatient status for this procedure and same-day discharge and anticipated recovery.  We will check a Ca1 25 today.  I discussed that if this is normal it is very reassuring, however if this is elevated it is nonspecific and may not indicate that there is an underlying malignancy.  HPI: Brenda Sutton is a 47 year old who was seen in consultation at the request of Dr Brenda Sutton for a right adnexal mass.  The patient has been having intermittent right lower quadrant pain for approximately 1 year.  When it was initially identified she underwent a transvaginal ultrasound scan which identified an ovarian cyst on the right.  She is recommended follow-up however because her pain resolved temporarily she did not do this.  However in October 2018 the pain again resumed.  In November 2018 she was seen by her family medicine provider who  ordered a transvaginal ultrasound scan which again identified a right ovarian cystic mass.  It was repeated on July 17, 2017 and this time measured 3.8 x 2.7 x 3.4 cm and was more complex in appearance than the prior November study.  Her pain has continued and she now has some hip pain on the right.  She has had 1 prior vaginal delivery.  She has had no prior abdominal surgeries.  Her menstrual cycles are about every 17-22 days but she has no intermenstrual bleeding.  She has had multiple recent urinary tract infections.  Current Meds:  Outpatient Encounter Medications as of 08/05/2017  Medication Sig  . FLUoxetine (PROZAC) 20 MG capsule TAKE 1 CAPSULE BY MOUTH EVERY DAY  . fluticasone (FLONASE) 50 MCG/ACT nasal spray Place into both nostrils daily.   No facility-administered encounter medications on file as of 08/05/2017.     Allergy:  Allergies  Allergen Reactions  . Erythromycin Nausea And Vomiting    Social Hx:   Social History   Socioeconomic History  . Marital status: Single    Spouse name: Not on file  . Number of children: Not on file  . Years of education: Not on file  . Highest education level: Not on file  Social Needs  . Financial resource strain: Not on file  . Food insecurity - worry: Not on file  . Food insecurity - inability: Not on file  . Transportation needs - medical: Not on file  . Transportation needs - non-medical: Not on file  Occupational History  . Not  on file  Tobacco Use  . Smoking status: Never Smoker  . Smokeless tobacco: Never Used  Substance and Sexual Activity  . Alcohol use: Yes    Alcohol/week: 1.0 oz    Types: 2 Standard drinks or equivalent per week  . Drug use: Yes    Types: Marijuana  . Sexual activity: Not on file  Other Topics Concern  . Not on file  Social History Narrative   No regular exercise.     Past Surgical Hx:  Past Surgical History:  Procedure Laterality Date  . TONSILLECTOMY      Past Medical Hx:  Past  Medical History:  Diagnosis Date  . Suicide attempt ([place])    at age 53    Past Gynecological History:  SVD x 1 No LMP recorded.  Family Hx:  Family History  Problem Relation Age of Onset  . Lymphoma Father        Non hodgkins   . Depression Unknown     Review of Systems:  Constitutional  Feels well,   ENT Normal appearing ears and nares bilaterally Skin/Breast  No rash, sores, jaundice, itching, dryness Cardiovascular  No chest pain, shortness of breath, or edema  Pulmonary  No cough or wheeze.  Gastro Intestinal  No nausea, vomitting, or diarrhoea. No bright red blood per rectum, no abdominal pain, change in bowel movement, or constipation.  Genito Urinary  No frequency, urgency, dysuria, normal frequent menses Musculo Skeletal  No myalgia, arthralgia, joint swelling or pain  Neurologic  No weakness, numbness, change in gait,  Psychology  No depression, anxiety, insomnia.   Vitals:  Blood pressure (!) 107/55, pulse 71, temperature 97.9 F (36.6 C), temperature source Oral, resp. rate 18, height 5' 3"  (1.6 m), weight 137 lb 4.8 oz (62.3 kg), SpO2 99 %.  Physical Exam: WD in NAD Neck  Supple NROM, without any enlargements.  Lymph Node Survey No cervical supraclavicular or inguinal adenopathy Cardiovascular  Pulse normal rate, regularity and rhythm. S1 and S2 normal.  Lungs  Clear to auscultation bilateraly, without wheezes/crackles/rhonchi. Good air movement.  Skin  No rash/lesions/breakdown  Psychiatry  Alert and oriented to person, place, and time  Abdomen  Normoactive bowel sounds, abdomen soft, and thin without evidence of hernia. Tender in RLQ to deep palpation. Back No CVA tenderness Genito Urinary  Vulva/vagina: Normal external female genitalia.   No lesions. No discharge or bleeding.  Bladder/urethra:  No lesions or masses, well supported bladder  Vagina: normal  Cervix: Normal appearing, no lesions.  Uterus:  Small, mobile, no parametrial  involvement or nodularity.  Adnexa: fullness, cystic, smooth, appreciate in right LQ  Rectal  deferred Extremities  No bilateral cyanosis, clubbing or edema.   Donaciano Eva, MD  08/05/2017, 5:43 PM

## 2017-08-06 ENCOUNTER — Other Ambulatory Visit: Payer: Self-pay | Admitting: Gynecologic Oncology

## 2017-08-06 DIAGNOSIS — N838 Other noninflammatory disorders of ovary, fallopian tube and broad ligament: Secondary | ICD-10-CM

## 2017-08-06 LAB — CA 125: CANCER ANTIGEN (CA) 125: 10.7 U/mL (ref 0.0–38.1)

## 2017-08-06 MED ORDER — IBUPROFEN 800 MG PO TABS: 800.0000 mg | ORAL_TABLET | Freq: Three times a day (TID) | ORAL | 0 refills | Status: DC | PRN

## 2017-08-06 MED ORDER — SENNA 8.6 MG PO TABS: 1.0000 | ORAL_TABLET | Freq: Every evening | ORAL | 0 refills | Status: DC | PRN

## 2017-08-06 MED ORDER — OXYCODONE HCL 5 MG PO TABS: 5.0000 mg | ORAL_TABLET | ORAL | 0 refills | Status: DC | PRN

## 2017-08-06 NOTE — Progress Notes (Signed)
Patient requesting prescriptions to be sent in prior to surgery so she can have them at home once discharged.  Advised the patient that the pain medication prescribed is for use only after surgery and she is advised at that time to not take and drive a car.  Scripts sent in.  Advised to call for any needs or concerns.

## 2017-08-07 ENCOUNTER — Telehealth: Payer: Self-pay | Admitting: Family Medicine

## 2017-08-07 ENCOUNTER — Telehealth: Payer: Self-pay

## 2017-08-07 MED ORDER — ALPRAZOLAM 0.25 MG PO TABS: 0.2500 mg | ORAL_TABLET | Freq: Two times a day (BID) | ORAL | 0 refills | Status: DC | PRN

## 2017-08-07 NOTE — Telephone Encounter (Signed)
Pt advised,verbalized understanding. 

## 2017-08-07 NOTE — Telephone Encounter (Signed)
Outgoing call to patient to give results of CA-125 per Joylene John NP.  Results given. No other needs at this time per patient.

## 2017-08-07 NOTE — Telephone Encounter (Signed)
Pt called requesting an Rx for xanax. She is being seen by oncology and having to have a procedure. She is having a hard time sleeping, and states this will help. Will route to PCP for review.

## 2017-08-07 NOTE — Telephone Encounter (Signed)
OK, rx sent.   Please tell her I'm sorry and to please hand in there.  .  Try to use sparingly.

## 2017-08-12 NOTE — Patient Instructions (Signed)
REVE CROCKET  08/12/2017   Your procedure is scheduled on: 08-18-17   Report to [facility] Main  Entrance    Report to admitting at 7:30AM   Call this number if you have problems the morning of surgery [phone]     Remember: Eat a light diet the day before surgery.  Examples including soups, broths, toast, yogurt, mashed potatoes.  Things to avoid include carbonated beverages (fizzy beverages), raw fruits and raw vegetables, or beans.  If your bowels are filled with gas, your surgeon will have difficulty visualizing your pelvic organs which increases your surgical risks.  Do not eat food or drink liquids :After Midnight.!!!!     Take these medicines the morning of surgery with A SIP OF WATER: xanax if needed, nasal spray if needed                                You may not have any metal on your body including hair pins and              piercings  Do not wear jewelry, make-up, lotions, powders or perfumes, deodorant             Do not wear nail polish.  Do not shave  48 hours prior to surgery.             Do not bring valuables to the hospital. [place].  Contacts, dentures or bridgework may not be worn into surgery.       Patients discharged the day of surgery will not be allowed to drive home.  Name and phone number of your driver:  Special Instructions: N/A              Please read over the following fact sheets you were given: _____________________________________________________________________    [facility] - Preparing for Surgery Before surgery, you can play an important role.  Because skin is not sterile, your skin needs to be as free of germs as possible.  You can reduce the number of germs on your skin by washing with CHG (chlorahexidine gluconate) soap before surgery.  CHG is an antiseptic cleaner which kills germs and bonds with the skin to continue killing germs even after  washing. Please DO NOT use if you have an allergy to CHG or antibacterial soaps.  If your skin becomes reddened/irritated stop using the CHG and inform your nurse when you arrive at Short Stay. Do not shave (including legs and underarms) for at least 48 hours prior to the first CHG shower.  You may shave your face/neck. Please follow these instructions carefully:  1.  Shower with CHG Soap the night before surgery and the  morning of Surgery.  2.  If you choose to wash your hair, wash your hair first as usual with your  normal  shampoo.  3.  After you shampoo, rinse your hair and body thoroughly to remove the  shampoo.                           4.  Use CHG as you would any other liquid soap.  You can apply chg directly  to the skin and wash  Gently with a scrungie or clean washcloth.  5.  Apply the CHG Soap to your body ONLY FROM THE NECK DOWN.   Do not use on face/ open                           Wound or open sores. Avoid contact with eyes, ears mouth and genitals (private parts).                       Wash face,  Genitals (private parts) with your normal soap.             6.  Wash thoroughly, paying special attention to the area where your surgery  will be performed.  7.  Thoroughly rinse your body with warm water from the neck down.  8.  DO NOT shower/wash with your normal soap after using and rinsing off  the CHG Soap.                9.  Pat yourself dry with a clean towel.            10.  Wear clean pajamas.            11.  Place clean sheets on your bed the night of your first shower and do not  sleep with pets. Day of Surgery : Do not apply any lotions/deodorants the morning of surgery.  Please wear clean clothes to the hospital/surgery center.  FAILURE TO FOLLOW THESE INSTRUCTIONS MAY RESULT IN THE CANCELLATION OF YOUR SURGERY PATIENT SIGNATURE_________________________________  NURSE  SIGNATURE__________________________________  ________________________________________________________________________   Adam Phenix  An incentive spirometer is a tool that can help keep your lungs clear and active. This tool measures how well you are filling your lungs with each breath. Taking long deep breaths may help reverse or decrease the chance of developing breathing (pulmonary) problems (especially infection) following:  A long period of time when you are unable to move or be active. BEFORE THE PROCEDURE   If the spirometer includes an indicator to show your best effort, your nurse or respiratory therapist will set it to a desired goal.  If possible, sit up straight or lean slightly forward. Try not to slouch.  Hold the incentive spirometer in an upright position. INSTRUCTIONS FOR USE  1. Sit on the edge of your bed if possible, or sit up as far as you can in bed or on a chair. 2. Hold the incentive spirometer in an upright position. 3. Breathe out normally. 4. Place the mouthpiece in your mouth and seal your lips tightly around it. 5. Breathe in slowly and as deeply as possible, raising the piston or the ball toward the top of the column. 6. Hold your breath for 3-5 seconds or for as long as possible. Allow the piston or ball to fall to the bottom of the column. 7. Remove the mouthpiece from your mouth and breathe out normally. 8. Rest for a few seconds and repeat Steps 1 through 7 at least 10 times every 1-2 hours when you are awake. Take your time and take a few normal breaths between deep breaths. 9. The spirometer may include an indicator to show your best effort. Use the indicator as a goal to work toward during each repetition. 10. After each set of 10 deep breaths, practice coughing to be sure your lungs are clear. If you have an incision (the cut made at the time of surgery),  support your incision when coughing by placing a pillow or rolled up towels firmly  against it. Once you are able to get out of bed, walk around indoors and cough well. You may stop using the incentive spirometer when instructed by your caregiver.  RISKS AND COMPLICATIONS  Take your time so you do not get dizzy or light-headed.  If you are in pain, you may need to take or ask for pain medication before doing incentive spirometry. It is harder to take a deep breath if you are having pain. AFTER USE  Rest and breathe slowly and easily.  It can be helpful to keep track of a log of your progress. Your caregiver can provide you with a simple table to help with this. If you are using the spirometer at home, follow these instructions: [place] IF:   You are having difficultly using the spirometer.  You have trouble using the spirometer as often as instructed.  Your pain medication is not giving enough relief while using the spirometer.  You develop fever of 100.5 F (38.1 C) or higher. SEEK IMMEDIATE MEDICAL CARE IF:   You cough up bloody sputum that had not been present before.  You develop fever of 102 F (38.9 C) or greater.  You develop worsening pain at or near the incision site. MAKE SURE YOU:   Understand these instructions.  Will watch your condition.  Will get help right away if you are not doing well or get worse. Document Released: 11/03/2006 Document Revised: 09/15/2011 Document Reviewed: 01/04/2007 ExitCare Patient Information 2014 ExitCare, [place].   ________________________________________________________________________  WHAT IS A BLOOD TRANSFUSION? Blood Transfusion Information  A transfusion is the replacement of blood or some of its parts. Blood is made up of multiple cells which provide different functions.  Red blood cells carry oxygen and are used for blood loss replacement.  White blood cells fight against infection.  Platelets control bleeding.  Plasma helps clot blood.  Other blood products are available for  specialized needs, such as hemophilia or other clotting disorders. BEFORE THE TRANSFUSION  Who gives blood for transfusions?   Healthy volunteers who are fully evaluated to make sure their blood is safe. This is blood bank blood. Transfusion therapy is the safest it has ever been in the practice of medicine. Before blood is taken from a donor, a complete history is taken to make sure that person has no history of diseases nor engages in risky social behavior (examples are intravenous drug use or sexual activity with multiple partners). The donor's travel history is screened to minimize risk of transmitting infections, such as malaria. The donated blood is tested for signs of infectious diseases, such as HIV and hepatitis. The blood is then tested to be sure it is compatible with you in order to minimize the chance of a transfusion reaction. If you or a relative donates blood, this is often done in anticipation of surgery and is not appropriate for emergency situations. It takes many days to process the donated blood. RISKS AND COMPLICATIONS Although transfusion therapy is very safe and saves many lives, the main dangers of transfusion include:   Getting an infectious disease.  Developing a transfusion reaction. This is an allergic reaction to something in the blood you were given. Every precaution is taken to prevent this. The decision to have a blood transfusion has been considered carefully by your caregiver before blood is given. Blood is not given unless the benefits outweigh the risks. AFTER THE TRANSFUSION  Right after receiving a blood transfusion, you will usually feel much better and more energetic. This is especially true if your red blood cells have gotten low (anemic). The transfusion raises the level of the red blood cells which carry oxygen, and this usually causes an energy increase.  The nurse administering the transfusion will monitor you carefully for complications. HOME CARE  INSTRUCTIONS  No special instructions are needed after a transfusion. You may find your energy is better. Speak with your caregiver about any limitations on activity for underlying diseases you may have. SEEK MEDICAL CARE IF:   Your condition is not improving after your transfusion.  You develop redness or irritation at the intravenous (IV) site. SEEK IMMEDIATE MEDICAL CARE IF:  Any of the following symptoms occur over the next 12 hours:  Shaking chills.  You have a temperature by mouth above 102 F (38.9 C), not controlled by medicine.  Chest, back, or muscle pain.  People around you feel you are not acting correctly or are confused.  Shortness of breath or difficulty breathing.  Dizziness and fainting.  You get a rash or develop hives.  You have a decrease in urine output.  Your urine turns a dark color or changes to pink, red, or brown. Any of the following symptoms occur over the next 10 days:  You have a temperature by mouth above 102 F (38.9 C), not controlled by medicine.  Shortness of breath.  Weakness after normal activity.  The white part of the eye turns yellow (jaundice).  You have a decrease in the amount of urine or are urinating less often.  Your urine turns a dark color or changes to pink, red, or brown. Document Released: 06/20/2000 Document Revised: 09/15/2011 Document Reviewed: 02/07/2008 [facility] Patient Information 2014 [place], [place].  _______________________________________________________________________

## 2017-08-13 ENCOUNTER — Encounter (HOSPITAL_COMMUNITY): Payer: Self-pay

## 2017-08-13 ENCOUNTER — Other Ambulatory Visit: Payer: Self-pay

## 2017-08-13 ENCOUNTER — Encounter (HOSPITAL_COMMUNITY)
Admission: RE | Admit: 2017-08-13 | Discharge: 2017-08-13 | Disposition: A | Discharge status: Home / Self Care | Payer: 59 | Source: Ambulatory Visit | Attending: Gynecologic Oncology | Admitting: Gynecologic Oncology

## 2017-08-13 DIAGNOSIS — Z01818 Encounter for other preprocedural examination: Secondary | ICD-10-CM | POA: Insufficient documentation

## 2017-08-13 DIAGNOSIS — N83201 Unspecified ovarian cyst, right side: Secondary | ICD-10-CM | POA: Diagnosis not present

## 2017-08-13 HISTORY — DX: Anemia, unspecified: D64.9

## 2017-08-13 HISTORY — DX: Other allergy status, other than to drugs and biological substances: Z91.09

## 2017-08-13 HISTORY — DX: Depression, unspecified: F32.A

## 2017-08-13 HISTORY — DX: Major depressive disorder, single episode, unspecified: F32.9

## 2017-08-13 HISTORY — DX: Urinary tract infection, site not specified: N39.0

## 2017-08-13 HISTORY — DX: Unspecified ovarian cyst, right side: N83.201

## 2017-08-13 HISTORY — DX: Acute cystitis without hematuria: N30.00

## 2017-08-13 LAB — COMPREHENSIVE METABOLIC PANEL
ALK PHOS: 35 U/L — AB (ref 38–126)
ALT: 16 U/L (ref 14–54)
ANION GAP: 3 — AB (ref 5–15)
AST: 18 U/L (ref 15–41)
Albumin: 3.9 g/dL (ref 3.5–5.0)
BUN: 12 mg/dL (ref 6–20)
CALCIUM: 8.8 mg/dL — AB (ref 8.9–10.3)
CHLORIDE: 106 mmol/L (ref 101–111)
CO2: 27 mmol/L (ref 22–32)
Creatinine, Ser: 0.74 mg/dL (ref 0.44–1.00)
GFR calc non Af Amer: >60 mL/min (ref >60)
Glucose, Bld: 92 mg/dL (ref 65–99)
Potassium: 4.2 mmol/L (ref 3.5–5.1)
SODIUM: 136 mmol/L (ref 135–145)
Total Bilirubin: 0.8 mg/dL (ref 0.3–1.2)
Total Protein: 6.5 g/dL (ref 6.5–8.1)

## 2017-08-13 LAB — CBC
HCT: 37.1 % (ref 36.0–46.0)
HEMOGLOBIN: 12.7 g/dL (ref 12.0–15.0)
MCH: 31 pg (ref 26.0–34.0)
MCHC: 34.2 g/dL (ref 30.0–36.0)
MCV: 90.5 fL (ref 78.0–100.0)
PLATELETS: 329 10*3/uL (ref 150–400)
RBC: 4.1 MIL/uL (ref 3.87–5.11)
RDW: 13.3 % (ref 11.5–15.5)
WBC: 3.7 10*3/uL — ABNORMAL LOW (ref 4.0–10.5)

## 2017-08-13 LAB — URINALYSIS, ROUTINE W REFLEX MICROSCOPIC
Bilirubin Urine: NEGATIVE
GLUCOSE, UA: NEGATIVE mg/dL
Ketones, ur: NEGATIVE mg/dL
Leukocytes, UA: NEGATIVE
Nitrite: NEGATIVE
PH: 7 (ref 5.0–8.0)
Protein, ur: NEGATIVE mg/dL
SPECIFIC GRAVITY, URINE: 1.013 (ref 1.005–1.030)

## 2017-08-13 LAB — PREGNANCY, URINE: Preg Test, Ur: NEGATIVE

## 2017-08-13 LAB — ABO/RH: ABO/RH(D): A POS

## 2017-08-18 ENCOUNTER — Ambulatory Visit (HOSPITAL_COMMUNITY): Payer: 59 | Admitting: Registered Nurse

## 2017-08-18 ENCOUNTER — Encounter (HOSPITAL_COMMUNITY)
Admission: RE | Disposition: A | Discharge status: Home / Self Care | Payer: Self-pay | Source: Ambulatory Visit | Attending: Gynecologic Oncology

## 2017-08-18 ENCOUNTER — Encounter (HOSPITAL_COMMUNITY): Payer: Self-pay | Admitting: *Deleted

## 2017-08-18 ENCOUNTER — Ambulatory Visit (HOSPITAL_COMMUNITY)
Admission: RE | Admit: 2017-08-18 | Discharge: 2017-08-18 | Disposition: A | Discharge status: Home / Self Care | Payer: 59 | Source: Ambulatory Visit | Attending: Gynecologic Oncology | Admitting: Gynecologic Oncology

## 2017-08-18 ENCOUNTER — Other Ambulatory Visit: Payer: Self-pay

## 2017-08-18 DIAGNOSIS — F419 Anxiety disorder, unspecified: Secondary | ICD-10-CM | POA: Diagnosis not present

## 2017-08-18 DIAGNOSIS — N838 Other noninflammatory disorders of ovary, fallopian tube and broad ligament: Secondary | ICD-10-CM

## 2017-08-18 DIAGNOSIS — Z881 Allergy status to other antibiotic agents status: Secondary | ICD-10-CM | POA: Diagnosis not present

## 2017-08-18 DIAGNOSIS — E039 Hypothyroidism, unspecified: Secondary | ICD-10-CM | POA: Diagnosis not present

## 2017-08-18 DIAGNOSIS — F329 Major depressive disorder, single episode, unspecified: Secondary | ICD-10-CM | POA: Diagnosis not present

## 2017-08-18 DIAGNOSIS — Z79899 Other long term (current) drug therapy: Secondary | ICD-10-CM | POA: Diagnosis not present

## 2017-08-18 DIAGNOSIS — N8302 Follicular cyst of left ovary: Secondary | ICD-10-CM | POA: Diagnosis not present

## 2017-08-18 DIAGNOSIS — N83201 Unspecified ovarian cyst, right side: Secondary | ICD-10-CM | POA: Diagnosis not present

## 2017-08-18 DIAGNOSIS — R102 Pelvic and perineal pain: Secondary | ICD-10-CM | POA: Diagnosis present

## 2017-08-18 HISTORY — PX: ROBOTIC ASSISTED BILATERAL SALPINGO OOPHERECTOMY: SHX6078

## 2017-08-18 LAB — TYPE AND SCREEN
ABO/RH(D): A POS
ANTIBODY SCREEN: NEGATIVE

## 2017-08-18 SURGERY — SALPINGO-OOPHORECTOMY, BILATERAL, ROBOT-ASSISTED
Anesthesia: General | Laterality: Left

## 2017-08-18 MED ORDER — CELECOXIB 200 MG PO CAPS
400.0000 mg | ORAL_CAPSULE | ORAL | Status: AC
  Administered 2017-08-18: 400 mg via ORAL
  Filled 2017-08-18: qty 2

## 2017-08-18 MED ORDER — ACETAMINOPHEN 500 MG PO TABS
1000.0000 mg | ORAL_TABLET | ORAL | Status: AC
  Administered 2017-08-18: 1000 mg via ORAL
  Filled 2017-08-18: qty 2

## 2017-08-18 MED ORDER — ROCURONIUM BROMIDE 100 MG/10ML IV SOLN
INTRAVENOUS | Status: DC | PRN
  Administered 2017-08-18: 45 mg via INTRAVENOUS
  Administered 2017-08-18: 5 mg via INTRAVENOUS

## 2017-08-18 MED ORDER — ONDANSETRON HCL 4 MG/2ML IJ SOLN
INTRAMUSCULAR | Status: DC | PRN
  Administered 2017-08-18: 4 mg via INTRAVENOUS

## 2017-08-18 MED ORDER — OXYCODONE-ACETAMINOPHEN 5-325 MG PO TABS: 1.0000 | ORAL_TABLET | ORAL | 0 refills | Status: DC | PRN

## 2017-08-18 MED ORDER — PROPOFOL 10 MG/ML IV BOLUS
INTRAVENOUS | Status: DC | PRN
  Administered 2017-08-18: 110 mg via INTRAVENOUS

## 2017-08-18 MED ORDER — LACTATED RINGERS IV SOLN: INTRAVENOUS | Status: DC

## 2017-08-18 MED ORDER — LIDOCAINE 2% (20 MG/ML) 5 ML SYRINGE
INTRAMUSCULAR | Status: DC | PRN
  Administered 2017-08-18: 1 mg/kg/h via INTRAVENOUS

## 2017-08-18 MED ORDER — DEXAMETHASONE SODIUM PHOSPHATE 10 MG/ML IJ SOLN
INTRAMUSCULAR | Status: AC
  Filled 2017-08-18: qty 1

## 2017-08-18 MED ORDER — LACTATED RINGERS IR SOLN
Status: DC | PRN
  Administered 2017-08-18: 1000 mL

## 2017-08-18 MED ORDER — PHENYLEPHRINE 40 MCG/ML (10ML) SYRINGE FOR IV PUSH (FOR BLOOD PRESSURE SUPPORT)
PREFILLED_SYRINGE | INTRAVENOUS | Status: AC
  Filled 2017-08-18: qty 10

## 2017-08-18 MED ORDER — METOCLOPRAMIDE HCL 5 MG/ML IJ SOLN: 10.0000 mg | Freq: Once | INTRAMUSCULAR | Status: DC | PRN

## 2017-08-18 MED ORDER — SUFENTANIL CITRATE 50 MCG/ML IV SOLN
INTRAVENOUS | Status: AC
Start: 2017-08-18 — End: ?
  Filled 2017-08-18: qty 1

## 2017-08-18 MED ORDER — FENTANYL CITRATE (PF) 100 MCG/2ML IJ SOLN: 25.0000 ug | INTRAMUSCULAR | Status: DC | PRN

## 2017-08-18 MED ORDER — LIDOCAINE 2% (20 MG/ML) 5 ML SYRINGE
INTRAMUSCULAR | Status: AC
  Filled 2017-08-18: qty 5

## 2017-08-18 MED ORDER — EPHEDRINE 5 MG/ML INJ
INTRAVENOUS | Status: AC
  Filled 2017-08-18: qty 10

## 2017-08-18 MED ORDER — LACTATED RINGERS IV SOLN
INTRAVENOUS | Status: DC
  Administered 2017-08-18 (×2): via INTRAVENOUS

## 2017-08-18 MED ORDER — GABAPENTIN 300 MG PO CAPS
300.0000 mg | ORAL_CAPSULE | ORAL | Status: AC
  Administered 2017-08-18: 300 mg via ORAL
  Filled 2017-08-18: qty 1

## 2017-08-18 MED ORDER — CEFAZOLIN SODIUM-DEXTROSE 2-4 GM/100ML-% IV SOLN
2.0000 g | INTRAVENOUS | Status: AC
  Administered 2017-08-18: 2 g via INTRAVENOUS
  Filled 2017-08-18: qty 100

## 2017-08-18 MED ORDER — PROPOFOL 10 MG/ML IV BOLUS
INTRAVENOUS | Status: AC
  Filled 2017-08-18: qty 20

## 2017-08-18 MED ORDER — MIDAZOLAM HCL 2 MG/2ML IJ SOLN
INTRAMUSCULAR | Status: AC
Start: 2017-08-18 — End: ?
  Filled 2017-08-18: qty 2

## 2017-08-18 MED ORDER — SUGAMMADEX SODIUM 200 MG/2ML IV SOLN
INTRAVENOUS | Status: DC | PRN
  Administered 2017-08-18: 150 mg via INTRAVENOUS

## 2017-08-18 MED ORDER — LIDOCAINE HCL (CARDIAC) 20 MG/ML IV SOLN
INTRAVENOUS | Status: DC | PRN
  Administered 2017-08-18: 15 mg via INTRAVENOUS
  Administered 2017-08-18: 50 mg via INTRAVENOUS

## 2017-08-18 MED ORDER — SUFENTANIL CITRATE 50 MCG/ML IV SOLN
INTRAVENOUS | Status: DC | PRN
  Administered 2017-08-18: 5 ug via INTRAVENOUS
  Administered 2017-08-18 (×2): 10 ug via INTRAVENOUS
  Administered 2017-08-18: 5 ug via INTRAVENOUS

## 2017-08-18 MED ORDER — MIDAZOLAM HCL 5 MG/5ML IJ SOLN
INTRAMUSCULAR | Status: DC | PRN
  Administered 2017-08-18: 2 mg via INTRAVENOUS

## 2017-08-18 MED ORDER — SUCCINYLCHOLINE CHLORIDE 20 MG/ML IJ SOLN
INTRAMUSCULAR | Status: DC | PRN
  Administered 2017-08-18: 100 mg via INTRAVENOUS

## 2017-08-18 MED ORDER — SUGAMMADEX SODIUM 200 MG/2ML IV SOLN
INTRAVENOUS | Status: AC
  Filled 2017-08-18: qty 2

## 2017-08-18 MED ORDER — DEXAMETHASONE SODIUM PHOSPHATE 4 MG/ML IJ SOLN
4.0000 mg | INTRAMUSCULAR | Status: AC
  Administered 2017-08-18: 6 mg via INTRAVENOUS

## 2017-08-18 MED ORDER — SCOPOLAMINE 1 MG/3DAYS TD PT72
1.0000 | MEDICATED_PATCH | TRANSDERMAL | Status: DC
  Administered 2017-08-18: 1.5 mg via TRANSDERMAL
  Filled 2017-08-18: qty 1

## 2017-08-18 MED ORDER — MEPERIDINE HCL 50 MG/ML IJ SOLN: 6.2500 mg | INTRAMUSCULAR | Status: DC | PRN

## 2017-08-18 MED ORDER — ONDANSETRON HCL 4 MG/2ML IJ SOLN
INTRAMUSCULAR | Status: AC
  Filled 2017-08-18: qty 2

## 2017-08-18 SURGICAL SUPPLY — 44 items
AGENT HMST KT MTR STRL THRMB (HEMOSTASIS)
APL ESCP 34 STRL LF DISP (HEMOSTASIS)
APPLICATOR SURGIFLO ENDO (HEMOSTASIS) IMPLANT
BAG LAPAROSCOPIC 12 15 PORT 16 (BASKET) IMPLANT
BAG RETRIEVAL 12/15 (BASKET)
BAG SPEC RTRVL LRG 6X4 10 (ENDOMECHANICALS) ×2
CHLORAPREP W/TINT 26ML (MISCELLANEOUS) ×1 IMPLANT
COVER BACK TABLE 60X90IN (DRAPES) ×3 IMPLANT
COVER TIP SHEARS 8 DVNC (MISCELLANEOUS) ×2 IMPLANT
COVER TIP SHEARS 8MM DA VINCI (MISCELLANEOUS) ×1
DRAPE ARM DVNC X/XI (DISPOSABLE) ×8 IMPLANT
DRAPE COLUMN DVNC XI (DISPOSABLE) ×2 IMPLANT
DRAPE DA VINCI XI ARM (DISPOSABLE) ×4
DRAPE DA VINCI XI COLUMN (DISPOSABLE) ×1
DRAPE SHEET LG 3/4 BI-LAMINATE (DRAPES) ×6 IMPLANT
DRAPE SURG IRRIG POUCH 19X23 (DRAPES) ×3 IMPLANT
ELECT REM PT RETURN 15FT ADLT (MISCELLANEOUS) ×3 IMPLANT
GLOVE BIO SURGEON STRL SZ 6 (GLOVE) ×12 IMPLANT
GLOVE BIO SURGEON STRL SZ 6.5 (GLOVE) ×6 IMPLANT
GOWN STRL REUS W/ TWL LRG LVL3 (GOWN DISPOSABLE) ×6 IMPLANT
GOWN STRL REUS W/TWL LRG LVL3 (GOWN DISPOSABLE) ×9
HOLDER FOLEY CATH W/STRAP (MISCELLANEOUS) ×1 IMPLANT
IRRIG SUCT STRYKERFLOW 2 WTIP (MISCELLANEOUS) ×3
IRRIGATION SUCT STRKRFLW 2 WTP (MISCELLANEOUS) ×2 IMPLANT
MANIPULATOR UTERINE 4.5 ZUMI (MISCELLANEOUS) ×3 IMPLANT
OBTURATOR OPTICAL STANDARD 8MM (TROCAR) ×1
OBTURATOR OPTICAL STND 8 DVNC (TROCAR) ×2
OBTURATOR OPTICALSTD 8 DVNC (TROCAR) ×2 IMPLANT
PACK ROBOT GYN CUSTOM WL (TRAY / TRAY PROCEDURE) ×3 IMPLANT
PAD POSITIONING PINK XL (MISCELLANEOUS) ×3 IMPLANT
POUCH SPECIMEN RETRIEVAL 10MM (ENDOMECHANICALS) ×2 IMPLANT
SEAL CANN UNIV 5-8 DVNC XI (MISCELLANEOUS) ×8 IMPLANT
SEAL XI 5MM-8MM UNIVERSAL (MISCELLANEOUS) ×4
SET TRI-LUMEN FLTR TB AIRSEAL (TUBING) ×3 IMPLANT
SOLUTION ELECTROLUBE (MISCELLANEOUS) ×3 IMPLANT
SURGIFLO W/THROMBIN 8M KIT (HEMOSTASIS) IMPLANT
SUT PROLENE 5 0 CC 1 (SUTURE) IMPLANT
SUT VIC AB 0 CT1 27 (SUTURE)
SUT VIC AB 0 CT1 27XBRD ANTBC (SUTURE) IMPLANT
TOWEL OR NON WOVEN STRL DISP B (DISPOSABLE) ×3 IMPLANT
TRAP SPECIMEN MUCOUS 40CC (MISCELLANEOUS) ×2 IMPLANT
TRAY FOLEY W/METER SILVER 16FR (SET/KITS/TRAYS/PACK) ×3 IMPLANT
UNDERPAD 30X30 (UNDERPADS AND DIAPERS) ×3 IMPLANT
WATER STERILE IRR 1000ML POUR (IV SOLUTION) ×3 IMPLANT

## 2017-08-18 NOTE — Op Note (Addendum)
OPERATIVE NOTE  Date: 08/18/17  Preoperative Diagnosis: ovarian cyst, pelvic pain  Postoperative Diagnosis:  same  Procedure(s) Performed: Robotic-assisted laparoscopic left salpingo-oophorectomy  Surgeon: Everitt Amber, M.D.  Assistant Surgeon: Lahoma Crocker M.D. (an MD assistant was necessary for tissue manipulation, management of robotic instrumentation, retraction and positioning due to the complexity of the case and hospital policies).   Anesthesia: Gen. endotracheal.  Specimens: left ovary and tube, pelvic washings  Estimated Blood Loss: 10 mL. Blood Replacement: None  Complications: none  Indication for Procedure:  Ovarian cyst, pelvic pain  Operative Findings: normal left tube and ovary, normal appendix, filmy adhesions between cecum and sidewall, normal appearing uterus. Left ovary enlarged to approximately 5cm with a cystic mass.  [place] pathology was consistent with benign ovarian cyst  Procedure: The patient's taken to the operating room and placed under general endotracheal anesthesia testing difficulty. She is placed in a dorsolithotomy position and cervical acromial pad was placed. The arms were tucked with care taken to pad the olecranon process. And prepped and draped in usual sterile fashion. A uterine manipulator (zumi) was placed vaginally. A 22m incision was made in the left upper quadrant palmer's point and a 5 mm Optiview trocar used to enter the abdomen under direct visualization. With entry into the abdomen and then maintenance of 15 mm of mercury the patient was placed in Trendelenburg position. An incision was made in the umbilicus and a 156LStrochar was placed through this site. Two incisions were made lateral to the umbilical incision in the left and right abdomen measuring 861m These incisions were made approximately 10 cm lateral to the umbilical incision. 8 mm robotic trochars were inserted. The robot was docked.  The abdomen was inspected as was the  pelvis.  Pelvic washings were obtained. An incision was made on the left pelvic side wall peritoneum parallel to the IP ligament and the retroperitoneal space entered. The left ureter was identified and the para-rectal space was developed. A window was created in the right broad ligament above the ureter. The left infundibulopelvic vessels were skeletonized cauterized and transected. The utero-ovarian ligament similarly was cauterized and transected. Specimen was placed in an Endo Catch bag.  The abdomen was copiously irrigated and drained and all operative sites inspected and hemostasis was assured  The robot was undocked. The contents of the left Endo Catch bag were first aspirated and then morcellated to facilitate removal from the abdominal cavity through the LUQ incision.  The ports were all remove. The fascial closure at the left upper quadrant port was made with 0 Vicryl.  All incisions were closed with a running subcuticular Monocryl suture. Dermabond was applied. Sponge, lap and needle counts were correct x 3.    The patient had sequential compression devices for VTE prophylaxis.         Disposition: PACU - stable         Condition: stable  RoDonaciano EvaMD

## 2017-08-18 NOTE — Anesthesia Procedure Notes (Addendum)
Procedure Name: Intubation Date/Time: 08/18/2017 10:12 AM Performed by: Lissa Morales, CRNA Pre-anesthesia Checklist: Patient identified, Emergency Drugs available, Suction available and Patient being monitored Patient Re-evaluated:Patient Re-evaluated prior to induction Oxygen Delivery Method: Circle system utilized Preoxygenation: Pre-oxygenation with 100% oxygen Induction Type: IV induction, Rapid sequence and Cricoid Pressure applied Ventilation: Mask ventilation without difficulty Laryngoscope Size: Glidescope and 3 Grade View: Grade III Tube type: Parker flex tip Tube size: 7.0 mm Number of attempts: 2 Airway Equipment and Method: Stylet and Oral airway Placement Confirmation: ETT inserted through vocal cords under direct vision,  positive ETCO2 and breath sounds checked- equal and bilateral Secured at: 21 cm Tube secured with: Tape Dental Injury: Teeth and Oropharynx as per pre-operative assessment  Difficulty Due To: Difficulty was anticipated, Difficult Airway- due to anterior larynx, Difficult Airway- due to limited oral opening and Difficult Airway- due to dentition Comments:  Very small mouth 1.5 FB, protruding upper teeth, unable to  Move  lower jaw over upper  TMJ, no chin 2 TMD. 1st look with glidescope needed to increase hockey stick on end to  Place thru cords . Atraumatic good visualization

## 2017-08-18 NOTE — Addendum Note (Signed)
Addendum  created 08/18/17 1244 by Lissa Morales, CRNA   Charge Capture section accepted, Visit diagnoses modified

## 2017-08-18 NOTE — Interval H&P Note (Signed)
History and Physical Interval Note:  08/18/2017 9:20 AM  Brenda Sutton  has presented today for surgery, with the diagnosis of RIGHT OVARIAN CYST  The various methods of treatment have been discussed with the patient and family. After consideration of risks, benefits and other options for treatment, the patient has consented to  Procedure(s): XI ROBOTIC ASSISTED RIGHT POSSIBLE BILATERAL SALPINGO OOPHORECTOMY (Right) XI ROBOTIC ASSISTED TOTAL HYSTERECTOMY AND POSSIBLE STAGING (N/A) as a surgical intervention .  The patient's history has been reviewed, patient examined, no change in status, stable for surgery.  I have reviewed the patient's chart and labs.  Questions were answered to the patient's satisfaction.     Donaciano Eva

## 2017-08-18 NOTE — Progress Notes (Signed)
Patient expressed concern that her left ovary was removed and not her right ovary.  I reviewed her intraoperative pictures with her and demonstrated that the cyst was in fact on the left ovary with a normal right ovary, and normal appendix.   She expressed concern that she had a history of 3 ultrasounds in the past year that showed a cyst on the right ovary, and is concerned that a cyst will develop in the future on the right. I explained that this is always possible, and there is a 1 in 10 risk that a cyst requiring surgery can develop in an ovary. I discussed that this low risk does not justify castration in a premenopausal woman.  We will be able to follow her right ovary and if a new cyst develops on that side determine if she requires surgery for it at that time. However, there was no pathology on the right ovary today to justify its removal.  All questions answered.   Donaciano Eva, MD

## 2017-08-18 NOTE — Transfer of Care (Signed)
Immediate Anesthesia Transfer of Care Note  Patient: Brenda Sutton  Procedure(s) Performed: XI ROBOTIC ASSISTED LEFT SALPINGO OOPHORECTOMY (Left )  Patient Location: PACU  Anesthesia Type:General  Level of Consciousness: awake, alert , oriented and patient cooperative  Airway & Oxygen Therapy: Patient Spontanous Breathing and Patient connected to face mask oxygen  Post-op Assessment: Report given to RN, Post -op Vital signs reviewed and stable and Patient moving all extremities X 4  Post vital signs: stable  Last Vitals:  Vitals:   08/18/17 1130 08/18/17 1131  BP:  (!) 102/47  Pulse:    Resp:    Temp: (!) 36.3 C   SpO2:      Last Pain:  Vitals:   08/18/17 0823  TempSrc:   PainSc: 3       Patients Stated Pain Goal: 4 ([id number])  Complications: No apparent anesthesia complications

## 2017-08-18 NOTE — Discharge Instructions (Signed)
Unilateral Salpingo-Oophorectomy, Care After Refer to this sheet in the next few weeks. These instructions provide you with information on caring for yourself after your procedure. Your health care provider may also give you more specific instructions. Your treatment has been planned according to current medical practices, but problems sometimes occur. Call your health care provider if you have any problems or questions after your procedure. What can I expect after the procedure? After the procedure, it is typical to have the following:  Abdominal pain that can be controlled with pain medicine.  Vaginal spotting.  Constipation.  Follow these instructions at home:  Get plenty of rest and sleep.  Only take over-the-counter or prescription medicines as directed by your health care provider. Do not take aspirin. It can cause bleeding.  Keep incision areas clean and dry. Remove or change any bandages (dressings) only as directed by your health care provider.  Follow your health care provider's advice regarding diet.  Drink enough fluids to keep your urine clear or pale yellow.  Limit exercise and activities as directed by your health care provider. Do not lift anything heavier than 5 pounds (2.3 kg) until your health care provider approves.  Do not drive until your health care provider approves.  Do not drink alcohol until your health care provider approves.  Do not have sexual intercourse until your health care provider says it is OK.  Take your temperature twice a day and write it down.  If you become constipated, you may: ? Ask your health care provider about taking a mild laxative. ? Add more fruit and bran to your diet. ? Drink more fluids.  Follow up with your health care provider as directed. Contact a health care provider if:  You have swelling or redness in the incision area.  You develop a rash.  You feel lightheaded.  You have pain that is not controlled with  medicine.  You have pain, swelling, or redness where the IV access tube was placed. Get help right away if:  You have a fever.  You develop increasing abdominal pain.  You see pus coming out of the incision, or the incision is separating.  You notice a bad smell coming from the wound or dressing.  You have excessive vaginal bleeding.  You feel sick to your stomach (nauseous) and vomit.  You have leg or chest pain.  You have pain when you urinate.  You develop shortness of breath.  You pass out. This information is not intended to replace advice given to you by your health care provider. Make sure you discuss any questions you have with your health care provider. Document Released: 04/19/2009 Document Revised: 05/23/2016 Document Reviewed: 12/15/2012 Elsevier Interactive Patient Education  [employer].

## 2017-08-18 NOTE — Anesthesia Postprocedure Evaluation (Signed)
Anesthesia Post Note  Patient: Brenda Sutton  Procedure(s) Performed: XI ROBOTIC ASSISTED LEFT SALPINGO OOPHORECTOMY (Left )     Patient location during evaluation: PACU Anesthesia Type: General Level of consciousness: awake and alert Pain management: pain level controlled Vital Signs Assessment: post-procedure vital signs reviewed and stable Respiratory status: spontaneous breathing, nonlabored ventilation, respiratory function stable and patient connected to nasal cannula oxygen Cardiovascular status: blood pressure returned to baseline and stable Postop Assessment: no apparent nausea or vomiting Anesthetic complications: no    Last Vitals:  Vitals:   08/18/17 1130 08/18/17 1131  BP:  (!) 102/47  Pulse:    Resp:    Temp: (!) 36.3 C   SpO2:      Last Pain:  Vitals:   08/18/17 0823  TempSrc:   PainSc: 3                  Montez Hageman

## 2017-08-18 NOTE — Anesthesia Preprocedure Evaluation (Addendum)
Anesthesia Evaluation  Patient identified by MRN, date of birth, ID band Patient awake    Reviewed: Allergy & Precautions, NPO status , Patient's Chart, lab work & pertinent test results  Airway Mallampati: II  TM Distance: >3 FB Neck ROM: Full    Dental no notable dental hx.    Pulmonary neg pulmonary ROS,    Pulmonary exam normal breath sounds clear to auscultation       Cardiovascular negative cardio ROS Normal cardiovascular exam Rhythm:Regular Rate:Normal     Neuro/Psych Anxiety Depression negative neurological ROS     GI/Hepatic negative GI ROS, Neg liver ROS,   Endo/Other  Hypothyroidism   Renal/GU negative Renal ROS  negative genitourinary   Musculoskeletal negative musculoskeletal ROS (+)   Abdominal   Peds negative pediatric ROS (+)  Hematology negative hematology ROS (+)   Anesthesia Other Findings   Reproductive/Obstetrics negative OB ROS                            Anesthesia Physical Anesthesia Plan  ASA: II  Anesthesia Plan: General   Post-op Pain Management:    Induction: Intravenous  PONV Risk Score and Plan: 4 or greater and Ondansetron, Dexamethasone, Midazolam and Scopolamine patch - Pre-op  Airway Management Planned: Oral ETT  Additional Equipment:   Intra-op Plan:   Post-operative Plan: Extubation in OR  Informed Consent: I have reviewed the patients History and Physical, chart, labs and discussed the procedure including the risks, benefits and alternatives for the proposed anesthesia with the patient or authorized representative who has indicated his/her understanding and acceptance.   Dental advisory given  Plan Discussed with: CRNA  Anesthesia Plan Comments:         Anesthesia Quick Evaluation

## 2017-09-11 ENCOUNTER — Inpatient Hospital Stay: Payer: 59 | Attending: Gynecologic Oncology | Admitting: Gynecologic Oncology

## 2017-09-11 ENCOUNTER — Encounter: Payer: Self-pay | Admitting: Gynecologic Oncology

## 2017-09-11 VITALS — BP 102/62 | HR 66 | Temp 97.7°F | Ht 63.0 in | Wt 135.1 lb

## 2017-09-11 DIAGNOSIS — Z90721 Acquired absence of ovaries, unilateral: Secondary | ICD-10-CM | POA: Diagnosis not present

## 2017-09-11 DIAGNOSIS — N83201 Unspecified ovarian cyst, right side: Secondary | ICD-10-CM | POA: Diagnosis not present

## 2017-09-11 NOTE — Patient Instructions (Addendum)
Please call Dr Denman George as needed at [phone] with any surgery related questions.  Please see Dr Madilyn Fireman annually for well woman care.  It is safe to return to the gym and safe to resume intercourse.   Thereasa Solo, MD

## 2017-09-11 NOTE — Progress Notes (Signed)
Follow-up Note: Gyn-Onc  Consult was requested by Dr. Ronney Asters for the evaluation of Brenda Sutton 47 y.o. female  CC:  Chief Complaint  Patient presents with  . left ovarian cyst    Assessment/Plan:  [name]  is a 47 y.o.  year old with a left ovarian cyst and a history of complex right ovarian cyst. S/p LSO on 08/18/17.  The right ovary was grossly normal at surgery and therefore not removed. I suspect that her history is significant for intermittent follicular cyst formation. Therefore if she develops a new cyst, it is probably reasonable to attempt OCP use for ovarian suppression.  She will follow-up with Dr Madilyn Fireman for ongoing wellness care.   HPI: Brenda Sutton is a 47 year old who was seen in consultation at the request of Dr Madilyn Fireman for a right adnexal mass.  The patient has been having intermittent right lower quadrant pain for approximately 1 year.  When it was initially identified she underwent a transvaginal ultrasound scan which identified an ovarian cyst on the right.  She is recommended follow-up however because her pain resolved temporarily she did not do this.  However in October 2018 the pain again resumed.  In November 2018 she was seen by her family medicine provider who ordered a transvaginal ultrasound scan which again identified a right ovarian cystic mass.  It was repeated on July 17, 2017 and this time measured 3.8 x 2.7 x 3.4 cm and was more complex in appearance than the prior November study.  Her pain has continued and she now has some hip pain on the right.  She has had 1 prior vaginal delivery.  She has had no prior abdominal surgeries.  Her menstrual cycles are about every 17-22 days but she has no intermenstrual bleeding.  She has had multiple recent urinary tract infections.  Interval Hx: On 08/18/17 she underwent robotic assisted unilateral salpingo-oophorectomy. Intraoperative findings were significant for a normal right ovary (see Epic  pictures) however there was a 4cm cyst on the left ovary. Therefore the left ovary and tube were removed. Frozen and final pathology revealed benign follicular cyst.   Postop she has done well with no specific complaints.   Current Meds:  Outpatient Encounter Medications as of 09/11/2017  Medication Sig  . ALPRAZolam (XANAX) 0.25 MG tablet Take 1-2 tablets (0.25-0.5 mg total) by mouth 2 (two) times daily as needed for anxiety.  Marland Kitchen FLUoxetine (PROZAC) 20 MG capsule TAKE 1 CAPSULE BY MOUTH EVERY DAY (Patient taking differently: TAKE 1 CAPSULE BY MOUTH EVERY DAY AT BEDTIME.)  . fluticasone (FLONASE) 50 MCG/ACT nasal spray Place 1 spray into both nostrils daily as needed (for allergies.).   Marland Kitchen ibuprofen (ADVIL,MOTRIN) 800 MG tablet Take 1 tablet (800 mg total) by mouth every 8 (eight) hours as needed for mild pain or moderate pain.  Marland Kitchen oxyCODONE (OXY IR/ROXICODONE) 5 MG immediate release tablet Take 1-2 tablets (5-10 mg total) by mouth every 4 (four) hours as needed for severe pain.  Marland Kitchen oxyCODONE-acetaminophen (PERCOCET/ROXICET) 5-325 MG tablet Take 1-2 tablets by mouth every 4 (four) hours as needed for severe pain.  Marland Kitchen senna (SENOKOT) 8.6 MG TABS tablet Take 1 tablet (8.6 mg total) by mouth at bedtime as needed for mild constipation.   No facility-administered encounter medications on file as of 09/11/2017.     Allergy:  Allergies  Allergen Reactions  . Erythromycin Nausea And Vomiting    Social Hx:   Social History   Socioeconomic History  .  Marital status: Single    Spouse name: Not on file  . Number of children: Not on file  . Years of education: Not on file  . Highest education level: Not on file  Social Needs  . Financial resource strain: Not on file  . Food insecurity - worry: Not on file  . Food insecurity - inability: Not on file  . Transportation needs - medical: Not on file  . Transportation needs - non-medical: Not on file  Occupational History  . Not on file  Tobacco Use   . Smoking status: Never Smoker  . Smokeless tobacco: Never Used  Substance and Sexual Activity  . Alcohol use: Yes    Alcohol/week: 1.0 oz    Types: 2 Standard drinks or equivalent per week  . Drug use: Yes    Types: Marijuana    Comment: denies   . Sexual activity: Not on file  Other Topics Concern  . Not on file  Social History Narrative   No regular exercise.     Past Surgical Hx:  Past Surgical History:  Procedure Laterality Date  .  tear duct   age 47   minor injury from toy had surgery to repair ; no issues post op   . ROBOTIC ASSISTED BILATERAL SALPINGO OOPHERECTOMY Left 08/18/2017   Procedure: XI ROBOTIC ASSISTED LEFT SALPINGO OOPHORECTOMY;  Surgeon: Everitt Amber, MD;  Location: [facility];  Service: Gynecology;  Laterality: Left;  . TONSILLECTOMY  age 47    childhood     Past Medical Hx:  Past Medical History:  Diagnosis Date  . Acute cystitis   . Anemia    h/o  . Depression   . Environmental allergies    grass   . Right ovarian cyst   . Suicide attempt ([place])    at age 47  . UTI (urinary tract infection)     Past Gynecological History:  SVD x 1 Patient's last menstrual period was 07/26/2017 (lmp unknown).  Family Hx:  Family History  Problem Relation Age of Onset  . Lymphoma Father        Non hodgkins   . Depression Unknown     Review of Systems:  Constitutional  Feels well,   ENT Normal appearing ears and nares bilaterally Skin/Breast  No rash, sores, jaundice, itching, dryness Cardiovascular  No chest pain, shortness of breath, or edema  Pulmonary  No cough or wheeze.  Gastro Intestinal  No nausea, vomitting, or diarrhoea. No bright red blood per rectum, no abdominal pain, change in bowel movement, or constipation.  Genito Urinary  No frequency, urgency, dysuria, normal frequent menses Musculo Skeletal  No myalgia, arthralgia, joint swelling or pain  Neurologic  No weakness, numbness, change in gait,  Psychology  No depression, anxiety,  insomnia.   Vitals:  Blood pressure 102/62, pulse 66, temperature 97.7 F (36.5 C), temperature source Oral, height 5' 3"  (1.6 m), weight 135 lb 1.6 oz (61.3 kg), last menstrual period 07/26/2017, SpO2 100 %.  Physical Exam: WD in NAD Neck  Supple NROM, without any enlargements.  Lymph Node Survey No cervical supraclavicular or inguinal adenopathy Cardiovascular  Pulse normal rate, regularity and rhythm. S1 and S2 normal.  Lungs  Clear to auscultation bilateraly, without wheezes/crackles/rhonchi. Good air movement.  Skin  No rash/lesions/breakdown  Psychiatry  Alert and oriented to person, place, and time  Abdomen  Normoactive bowel sounds, abdomen soft, and thin without evidence of hernia. Nontender, well healed incisions.  Back No CVA tenderness Genito Urinary  Vulva/vagina: Normal external female genitalia.   No lesions. No discharge or bleeding.  Bladder/urethra:  No lesions or masses, well supported bladder  Vagina: normal, no abnormal discharge.  Cervix: Normal appearing, no lesions.  Uterus:  Small, mobile, no parametrial involvement or nodularity.  Adnexa: no masses Rectal  deferred Extremities  No bilateral cyanosis, clubbing or edema.   Thereasa Solo, MD  09/11/2017, 4:48 PM

## 2017-11-26 ENCOUNTER — Ambulatory Visit (INDEPENDENT_AMBULATORY_CARE_PROVIDER_SITE_OTHER): Payer: 59

## 2017-11-26 ENCOUNTER — Ambulatory Visit (INDEPENDENT_AMBULATORY_CARE_PROVIDER_SITE_OTHER): Payer: 59 | Admitting: Family Medicine

## 2017-11-26 ENCOUNTER — Encounter: Payer: Self-pay | Admitting: Family Medicine

## 2017-11-26 VITALS — BP 102/47 | HR 55 | Ht 63.0 in | Wt 133.0 lb

## 2017-11-26 DIAGNOSIS — M25572 Pain in left ankle and joints of left foot: Secondary | ICD-10-CM

## 2017-11-26 DIAGNOSIS — S90222A Contusion of left lesser toe(s) with damage to nail, initial encounter: Secondary | ICD-10-CM

## 2017-11-26 DIAGNOSIS — M25475 Effusion, left foot: Secondary | ICD-10-CM

## 2017-11-26 NOTE — Patient Instructions (Signed)
Thank you for coming in today. This is a common running problem.  You can treat this at home as well.  Do not bore a hole though the toenail if you have an injury.    Subungual Hematoma A subungual hematoma, sometimes called runner's toe or tennis toe, is a collection of blood under a fingernail or toenail. What are the causes? This condition is caused by a crush injury to the finger or toe that breaks a blood vessel beneath the nail. It can develop after a hard, direct hit to a finger or toe, or after pressure is repeatedly put on an injured finger or toe. What are the signs or symptoms? Symptoms of this condition include:  A blue or dark blue color under the nail.  Pain or throbbing in the injured area.  How is this diagnosed? This condition is diagnosed with a medical history and a physical exam. How is this treated? Usually treatment is not needed for this condition. It usually goes away with time. If the condition is causing a lot of pain, or if a lot of blood collects under the fingernail or toenail, a health care provider may perform a painless procedure to drain the blood from beneath the nail. If there is a cut under the nail that requires stitches (sutures), the nail may be removed. Follow these instructions at home:  If directed, apply ice to the injured area: ? Put ice in a plastic bag. ? Place a towel between your skin and the bag. ? Leave the ice on for 20 minutes, 2-3 times per day.  Raise (elevate) the injured finger or toe above the level of your heart while you are sitting or lying down. This will help to decrease pain and swelling.  If part of your nail falls off, gently trim the remaining nail. This prevents the remaining nail from catching on something and causing further injury.  Follow instructions from your health care provider about how to take care of your injury. Make sure you: ? Wash your hands with soap and water before you change your bandage (dressing).  If soap and water are not available, use hand sanitizer. ? Change your dressing as told by your health care provider. ? Leave stitches (sutures) in place. You may have these if your health care provider repaired a cut under the nail. The sutures may need to stay in place for 2 weeks or longer.  Take over-the-counter and prescription medicines only as told by your health care provider.  Keep all follow-up visits. This is important. If you had a procedure to drain the blood from under your nail, follow up with your health care provider as told. Contact a health care provider if:  Your pain is not controlled with medicine.  You have redness, swelling, or pain around your nail. Get help right away if:  You have fluid, blood, or pus coming from your nail.  You have a fever. This information is not intended to replace advice given to you by your health care provider. Make sure you discuss any questions you have with your health care provider. Document Released: 06/20/2000 Document Revised: 02/20/2016 Document Reviewed: 11/08/2014 Elsevier Interactive Patient Education  2018 [employer].

## 2017-11-26 NOTE — Progress Notes (Signed)
Brenda Sutton is a 47 y.o. female who presents to [facility] today for left toe pain.  Brenda Sutton is a healthy 47 year old woman who runs recreationally for health and exercise.  She notes that she runs about 12 miles per week and has been increasing her running recently and increasing her speed recently.  Her goal is to run a 5K less than 30 minutes.  She notes about 2 days ago she developed toe pain and purplish bruising underneath her left second toenail.  She cannot recall any injury.  She denies any fevers or chills vomiting or diarrhea.    ROS:  As above  Exam:  BP (!) 102/47   Pulse (!) 55   Ht 5' 3"  (1.6 m)   Wt 133 lb (60.3 kg)   BMI 23.56 kg/m  General: Well Developed, well nourished, and in no acute distress.  Neuro/Psych: Alert and oriented x3, extra-ocular muscles intact, able to move all 4 extremities, sensation grossly intact. Skin: Warm and dry, no rashes noted.  Respiratory: Not using accessory muscles, speaking in full sentences, trachea midline.  Cardiovascular: Pulses palpable, no extremity edema. Abdomen: Does not appear distended. MSK:  Left foot normal-appearing with the exception of subungual hematoma second toenail.  The second toe and foot are nontender at the exception at the DIP and distal phalanx and toenail. Capillary refill and sensation are intact distally toes 1, 3 4 and 5  Lab and Radiology Results Personal interpretation of x-ray images left second toe show no fractures or acute changes.  Awaiting formal radiology review  Subungual hematoma drainage: Consent obtained and timeout performed Toenail cleaned with rubbing alcohol 18-gauge needle was used to drill through the midportion of the toenail until fluid was expressed.  Assessment and Plan: 47 y.o. female with  Left second toe subungual hematoma occurring without trauma.  This is a common runners injury.  X-ray unremarkable.  Subungual hematoma  drained today as it is around 23 hours old and reasonable.  Plan to continue exercise as tolerated.  Consider shoe fitment.  Recheck as needed.    Orders Placed This Encounter  Procedures  . DG Toe 2nd Left    Order Specific Question:   Reason for exam:    Answer:   eval pain and subungual hematoma    Order Specific Question:   Is the patient pregnant?    Answer:   No    Order Specific Question:   Preferred imaging location?    Answer:   [facility]   No orders of the defined types were placed in this encounter.   Historical information moved to improve visibility of documentation.  Past Medical History:  Diagnosis Date  . Acute cystitis   . Anemia    h/o  . Depression   . Environmental allergies    grass   . Right ovarian cyst   . Suicide attempt ([place])    at age 89  . UTI (urinary tract infection)    Past Surgical History:  Procedure Laterality Date  .  tear duct   age 80   minor injury from toy had surgery to repair ; no issues post op   . ROBOTIC ASSISTED BILATERAL SALPINGO OOPHERECTOMY Left 08/18/2017   Procedure: XI ROBOTIC ASSISTED LEFT SALPINGO OOPHORECTOMY;  Surgeon: Everitt Amber, MD;  Location: [facility];  Service: Gynecology;  Laterality: Left;  . TONSILLECTOMY  age 24    childhood    Social History   Tobacco Use  .  Smoking status: Never Smoker  . Smokeless tobacco: Never Used  Substance Use Topics  . Alcohol use: Yes    Alcohol/week: 1.0 oz    Types: 2 Standard drinks or equivalent per week   family history includes Depression in her unknown relative; Lymphoma in her father.  Medications: Current Outpatient Medications  Medication Sig Dispense Refill  . ALPRAZolam (XANAX) 0.25 MG tablet Take 1-2 tablets (0.25-0.5 mg total) by mouth 2 (two) times daily as needed for anxiety. 20 tablet 0  . FLUoxetine (PROZAC) 20 MG capsule TAKE 1 CAPSULE BY MOUTH EVERY DAY (Patient taking differently: TAKE 1 CAPSULE BY MOUTH EVERY DAY AT BEDTIME.) 90 capsule 1    . fluticasone (FLONASE) 50 MCG/ACT nasal spray Place 1 spray into both nostrils daily as needed (for allergies.).     Marland Kitchen ibuprofen (ADVIL,MOTRIN) 800 MG tablet Take 1 tablet (800 mg total) by mouth every 8 (eight) hours as needed for mild pain or moderate pain. 30 tablet 0  . oxyCODONE (OXY IR/ROXICODONE) 5 MG immediate release tablet Take 1-2 tablets (5-10 mg total) by mouth every 4 (four) hours as needed for severe pain. 30 tablet 0  . oxyCODONE-acetaminophen (PERCOCET/ROXICET) 5-325 MG tablet Take 1-2 tablets by mouth every 4 (four) hours as needed for severe pain. 10 tablet 0  . senna (SENOKOT) 8.6 MG TABS tablet Take 1 tablet (8.6 mg total) by mouth at bedtime as needed for mild constipation. 30 each 0   No current facility-administered medications for this visit.    Allergies  Allergen Reactions  . Erythromycin Nausea And Vomiting      Discussed warning signs or symptoms. Please see discharge instructions. Patient expresses understanding.

## 2018-04-16 ENCOUNTER — Encounter: Payer: Self-pay | Admitting: Emergency Medicine

## 2018-04-16 ENCOUNTER — Emergency Department (INDEPENDENT_AMBULATORY_CARE_PROVIDER_SITE_OTHER)
Admission: EM | Admit: 2018-04-16 | Discharge: 2018-04-16 | Disposition: A | Discharge status: Home / Self Care | Payer: 59 | Source: Home / Self Care | Attending: Family Medicine | Admitting: Family Medicine

## 2018-04-16 ENCOUNTER — Other Ambulatory Visit: Payer: Self-pay

## 2018-04-16 DIAGNOSIS — B349 Viral infection, unspecified: Secondary | ICD-10-CM

## 2018-04-16 DIAGNOSIS — B001 Herpesviral vesicular dermatitis: Secondary | ICD-10-CM | POA: Diagnosis not present

## 2018-04-16 MED ORDER — VALACYCLOVIR HCL 1 G PO TABS: 2000.0000 mg | ORAL_TABLET | Freq: Two times a day (BID) | ORAL | 0 refills | Status: AC

## 2018-04-16 NOTE — ED Provider Notes (Signed)
[name]    CSN: [id number] Arrival date & time: 04/16/18  [place]     History   Chief Complaint Chief Complaint  Patient presents with  . Headache  . Mouth Lesions  . Fever    HPI Brenda Sutton is a 47 y.o. female.   Patient reports that she developed a cold sore on her mid-lower lip one week ago but did not feel ill.  During the past 3 to 4 days she has become increasingly fatigued with decreased appetite, frontal headache, and soreness in her neck.  She denies sore throat, nasal congestion, cough, etc.  Last night she had low grade fever to 100.  The history is provided by the patient.    Past Medical History:  Diagnosis Date  . Acute cystitis   . Anemia    h/o  . Depression   . Environmental allergies    grass   . Right ovarian cyst   . Suicide attempt ([place])    at age 20  . UTI (urinary tract infection)     Patient Active Problem List   Diagnosis Date Noted  . Right ovarian cyst 08/18/2017  . Routine general medical examination at a health care facility 01/11/2016  . Metatarsophalangeal (joint) sprain 10/27/2013  . Hypothyroidism 05/16/2013  . BENIGN POSITIONAL VERTIGO 10/27/2008  . Depression 07/30/2007  . GAD (generalized anxiety disorder) 11/03/2006    Past Surgical History:  Procedure Laterality Date  .  tear duct   age 41   minor injury from toy had surgery to repair ; no issues post op   . ROBOTIC ASSISTED BILATERAL SALPINGO OOPHERECTOMY Left 08/18/2017   Procedure: XI ROBOTIC ASSISTED LEFT SALPINGO OOPHORECTOMY;  Surgeon: Everitt Amber, MD;  Location: [facility];  Service: Gynecology;  Laterality: Left;  . TONSILLECTOMY  age 46    childhood     OB History   None      Home Medications    Prior to Admission medications   Medication Sig Start Date End Date Taking? Authorizing Provider  ALPRAZolam (XANAX) 0.25 MG tablet Take 1-2 tablets (0.25-0.5 mg total) by mouth 2 (two) times daily as needed for anxiety. 08/07/17   Hali Marry, MD  FLUoxetine (PROZAC) 20 MG capsule TAKE 1 CAPSULE BY MOUTH EVERY DAY Patient taking differently: TAKE 1 CAPSULE BY MOUTH EVERY DAY AT BEDTIME. 06/17/17   Hali Marry, MD  fluticasone (FLONASE) 50 MCG/ACT nasal spray Place 1 spray into both nostrils daily as needed (for allergies.).     [provider]  ibuprofen (ADVIL,MOTRIN) 800 MG tablet Take 1 tablet (800 mg total) by mouth every 8 (eight) hours as needed for mild pain or moderate pain. 08/06/17   Joylene John D, NP  oxyCODONE (OXY IR/ROXICODONE) 5 MG immediate release tablet Take 1-2 tablets (5-10 mg total) by mouth every 4 (four) hours as needed for severe pain. 08/06/17   Joylene John D, NP  oxyCODONE-acetaminophen (PERCOCET/ROXICET) 5-325 MG tablet Take 1-2 tablets by mouth every 4 (four) hours as needed for severe pain. 08/18/17   Lahoma Crocker, MD  senna (SENOKOT) 8.6 MG TABS tablet Take 1 tablet (8.6 mg total) by mouth at bedtime as needed for mild constipation. 08/06/17   Cross, Lenna Sciara D, NP  valACYclovir (VALTREX) 1000 MG tablet Take 2 tablets (2,000 mg total) by mouth 2 (two) times daily for 1 day. 04/16/18 04/17/18  Kandra Nicolas, MD    Family History Family History  Problem Relation Age of Onset  .  Lymphoma Father        Non hodgkins   . Depression Unknown     Social History Social History   Tobacco Use  . Smoking status: Never Smoker  . Smokeless tobacco: Never Used  Substance Use Topics  . Alcohol use: Yes    Alcohol/week: 2.0 standard drinks    Types: 2 Standard drinks or equivalent per week  . Drug use: Yes    Types: Marijuana    Comment: denies      Allergies   Erythromycin   Review of Systems Review of Systems No sore throat No cough No pleuritic pain No wheezing No nasal congestion No post-nasal drainage No sinus pain/pressure No itchy/red eyes No earache No hemoptysis No SOB + fever, + chills No nausea No vomiting No abdominal pain No  diarrhea No urinary symptoms No skin rash + fatigue ? myalgias + headache    Physical Exam Triage Vital Signs ED Triage Vitals  Enc Vitals Group     BP 04/16/18 1921 94/61     Pulse Rate 04/16/18 1921 (!) 58     Resp 04/16/18 1921 16     Temp 04/16/18 1921 98.2 F (36.8 C)     Temp Source 04/16/18 1921 Oral     SpO2 04/16/18 1921 98 %     Weight 04/16/18 1922 127 lb (57.6 kg)     Height 04/16/18 1922 5' 3"  (1.6 m)     Head Circumference --      Peak Flow --      Pain Score 04/16/18 1922 2     Pain Loc --      Pain Edu? --      Excl. in [place]? --    No data found.  Updated Vital Signs BP 94/61 (BP Location: Right Arm)   Pulse (!) 58   Temp 98.2 F (36.8 C) (Oral)   Resp 16   Ht 5' 3"  (1.6 m)   Wt 57.6 kg   SpO2 98%   BMI 22.50 kg/m   Visual Acuity Right Eye Distance:   Left Eye Distance:   Bilateral Distance:    Right Eye Near:   Left Eye Near:    Bilateral Near:     Physical Exam  HENT:  Mouth/Throat:    Nursing notes and Vital Signs reviewed. Appearance:  Patient appears stated age, and in no acute distress Eyes:  Pupils are equal, round, and reactive to light and accomodation.  Extraocular movement is intact.  Conjunctivae are not inflamed  Ears:  Canals normal.  Tympanic membranes normal.  Nose:  Mildly congested turbinates.  No sinus tenderness.  Mouth:  Superficial excoriation mid-lower lip as noted in diagram above. Pharynx:  Normal Neck:  Supple.  Enlarged posterior/lateral nodes are palpated bilaterally, tender to palpation on the left.   Lungs:  Clear to auscultation.  Breath sounds are equal.  Moving air well. Heart:  Regular rate and rhythm without murmurs, rubs, or gallops.  Abdomen:  Nontender without masses or hepatosplenomegaly.  Bowel sounds are present.  No CVA or flank tenderness.  Extremities:  No edema.  Skin:  No rash present.     UC Treatments / Results  Labs (all labs ordered are listed, but only abnormal results are  displayed) Labs Reviewed - No data to display  EKG None  Radiology No results found.  Procedures Procedures (including critical care time)  Medications Ordered in UC Medications - No data to display  Initial Impression / Assessment and  Plan / UC Course  I have reviewed the triage vital signs and the nursing notes.  Pertinent labs & imaging results that were available during my care of the patient were reviewed by me and considered in my medical decision making (see chart for details).    There is no evidence of bacterial infection today.  Suspect early viral URI Begin Valtrex. Followup with Family Doctor if not improved in 7 to 10 days.   Final Clinical Impressions(s) / UC Diagnoses   Final diagnoses:  Cold sore  Viral illness     Discharge Instructions     If cold-like symptoms develop, try the following: Take plain guaifenesin (1238m extended release tabs such as Mucinex) twice daily, with plenty of water, for cough and congestion.  May add Pseudoephedrine (313m one or two every 4 to 6 hours) for sinus congestion.  Get adequate rest.   May use Afrin nasal spray (or generic oxymetazoline) each morning for about 5 days and then discontinue.  Also recommend using saline nasal spray several times daily and saline nasal irrigation (AYR is a common brand).  Use Flonase nasal spray each morning after using Afrin nasal spray and saline nasal irrigation. Try warm salt water gargles for sore throat.  Stop all antihistamines for now, and other non-prescription cough/cold preparations. May take Ibuprofen 20068m4 tabs every 8 hours with food for body aches, headache, etc. May take Delsym Cough Suppressant at bedtime for nighttime cough.     ED Prescriptions    Medication Sig Dispense Auth. Provider   valACYclovir (VALTREX) 1000 MG tablet Take 2 tablets (2,000 mg total) by mouth 2 (two) times daily for 1 day. 4 tablet BeeKandra NicolasD        BeeKandra NicolasMD 04/17/18 1540

## 2018-04-16 NOTE — Discharge Instructions (Addendum)
If cold-like symptoms develop, try the following: Take plain guaifenesin (1243m extended release tabs such as Mucinex) twice daily, with plenty of water, for cough and congestion.  May add Pseudoephedrine (359m one or two every 4 to 6 hours) for sinus congestion.  Get adequate rest.   May use Afrin nasal spray (or generic oxymetazoline) each morning for about 5 days and then discontinue.  Also recommend using saline nasal spray several times daily and saline nasal irrigation (AYR is a common brand).  Use Flonase nasal spray each morning after using Afrin nasal spray and saline nasal irrigation. Try warm salt water gargles for sore throat.  Stop all antihistamines for now, and other non-prescription cough/cold preparations. May take Ibuprofen 20042m4 tabs every 8 hours with food for body aches, headache, etc. May take Delsym Cough Suppressant at bedtime for nighttime cough.

## 2018-04-16 NOTE — ED Triage Notes (Signed)
Patient has had intermittent headaches and low grade fevers plus rash on lower lip for past 3 days. Took tylenol 655m at 1300.

## 2018-04-26 ENCOUNTER — Telehealth: Payer: Self-pay

## 2018-04-26 MED ORDER — VALACYCLOVIR HCL 1 G PO TABS: 1000.0000 mg | ORAL_TABLET | Freq: Two times a day (BID) | ORAL | 0 refills | Status: DC

## 2018-04-26 NOTE — Telephone Encounter (Signed)
Shayley was seen in the Urgent Care for a cold sore. She was prescribed Valacyclovir 2 tablet BID for 1 day, total 4 tablets. The cold sores did get better but has not resolved. She would like a refill of the Valacyclovir sent to [facility] in [place].

## 2018-07-12 ENCOUNTER — Other Ambulatory Visit: Payer: Self-pay | Admitting: Family Medicine

## 2018-09-21 ENCOUNTER — Other Ambulatory Visit: Payer: Self-pay | Admitting: Family Medicine

## 2018-09-28 ENCOUNTER — Ambulatory Visit (INDEPENDENT_AMBULATORY_CARE_PROVIDER_SITE_OTHER): Payer: 59 | Admitting: Family Medicine

## 2018-09-28 ENCOUNTER — Encounter: Payer: Self-pay | Admitting: Family Medicine

## 2018-09-28 ENCOUNTER — Other Ambulatory Visit: Payer: Self-pay

## 2018-09-28 VITALS — BP 88/55 | HR 65 | Ht 63.0 in | Wt 133.0 lb

## 2018-09-28 DIAGNOSIS — Z1231 Encounter for screening mammogram for malignant neoplasm of breast: Secondary | ICD-10-CM

## 2018-09-28 DIAGNOSIS — F331 Major depressive disorder, recurrent, moderate: Secondary | ICD-10-CM | POA: Diagnosis not present

## 2018-09-28 DIAGNOSIS — F411 Generalized anxiety disorder: Secondary | ICD-10-CM | POA: Diagnosis not present

## 2018-09-28 DIAGNOSIS — Z0000 Encounter for general adult medical examination without abnormal findings: Secondary | ICD-10-CM

## 2018-09-28 MED ORDER — FLUOXETINE HCL 40 MG PO CAPS: 40.0000 mg | ORAL_CAPSULE | Freq: Every day | ORAL | 1 refills | Status: DC

## 2018-09-28 MED ORDER — ALPRAZOLAM 0.25 MG PO TABS: 0.2500 mg | ORAL_TABLET | Freq: Two times a day (BID) | ORAL | 0 refills | Status: DC | PRN

## 2018-09-28 NOTE — Progress Notes (Signed)
Subjective:     Brenda Sutton is a 48 y.o. female and is here for a comprehensive physical exam. The patient reports no problems.  She has been exercising she runs a couple times a week.  She did not get a flu vaccine this year.  He is overdue for her mammogram last one was in 2017.  F/U GAD and Depression.  She reports feeling down about half aht time and reports having thoughts of being better off dead. She is a stressed about the current situation and pandemic going on.  Her husband works for a health system but she is able to work from home.  Social History   Socioeconomic History  . Marital status: Single    Spouse name: Not on file  . Number of children: Not on file  . Years of education: Not on file  . Highest education level: Not on file  Occupational History  . Not on file  Social Needs  . Financial resource strain: Not on file  . Food insecurity:    Worry: Not on file    Inability: Not on file  . Transportation needs:    Medical: Not on file    Non-medical: Not on file  Tobacco Use  . Smoking status: Never Smoker  . Smokeless tobacco: Never Used  Substance and Sexual Activity  . Alcohol use: Yes    Alcohol/week: 2.0 standard drinks    Types: 2 Standard drinks or equivalent per week  . Drug use: Yes    Types: Marijuana    Comment: denies   . Sexual activity: Not on file  Lifestyle  . Physical activity:    Days per week: Not on file    Minutes per session: Not on file  . Stress: Not on file  Relationships  . Social connections:    Talks on phone: Not on file    Gets together: Not on file    Attends religious service: Not on file    Active member of club or organization: Not on file    Attends meetings of clubs or organizations: Not on file    Relationship status: Not on file  . Intimate partner violence:    Fear of current or ex partner: Not on file    Emotionally abused: Not on file    Physically abused: Not on file    Forced sexual activity: Not on  file  Other Topics Concern  . Not on file  Social History Narrative   No regular exercise.    Health Maintenance  Topic Date Due  . INFLUENZA VACCINE  10/05/2018 (Originally 02/04/2018)  . PAP SMEAR-Modifier  01/10/2021  . TETANUS/TDAP  03/16/2022  . HIV Screening  Completed    The following portions of the patient's history were reviewed and updated as appropriate: allergies, current medications, past family history, past medical history, past social history, past surgical history and problem list.  Review of Systems A comprehensive review of systems was negative.   Objective:    BP (!) 88/55   Pulse 65   Ht 5' 3"  (1.6 m)   Wt 133 lb (60.3 kg)   SpO2 99%   BMI 23.56 kg/m  General appearance: alert, cooperative and appears stated age Head: Normocephalic, without obvious abnormality, atraumatic Eyes: conj clear, EOMI, PEERLA Ears: normal TM's and external ear canals both ears Nose: Nares normal. Septum midline. Mucosa normal. No drainage or sinus tenderness. Throat: lips, mucosa, and tongue normal; teeth and gums normal Neck: no adenopathy,  no carotid bruit, no JVD, supple, symmetrical, trachea midline and thyroid not enlarged, symmetric, no tenderness/mass/nodules Back: symmetric, no curvature. ROM normal. No CVA tenderness. Lungs: clear to auscultation bilaterally Breasts: normal appearance, no masses or tenderness Heart: regular rate and rhythm, S1, S2 normal, no murmur, click, rub or gallop Abdomen: soft, non-tender; bowel sounds normal; no masses,  no organomegaly Extremities: extremities normal, atraumatic, no cyanosis or edema Pulses: 2+ and symmetric Skin: Skin color, texture, turgor normal. No rashes or lesions Lymph nodes: Cervical, supraclavicular, and axillary nodes normal. Neurologic: Alert and oriented X 3, normal strength and tone. Normal symmetric reflexes. Normal coordination and gait    Assessment:    Healthy female exam.      Plan:     See After  Visit Summary for Counseling Recommendations   Keep up a regular exercise program and make sure you are eating a healthy diet Try to eat 4 servings of dairy a day, or if you are lactose intolerant take a calcium with vitamin D daily.  Your vaccines are up to date.   Depression/anxiety-symptoms are particularly ramped up right now.  We discussed options.  We will increase her fluoxetine to 40 mg.  Encouraged her to think about doing some telemedicine behavioral health counseling.  She said she is potentially open to it but wants to think about it.

## 2018-09-28 NOTE — Patient Instructions (Signed)

## 2018-10-26 ENCOUNTER — Ambulatory Visit (INDEPENDENT_AMBULATORY_CARE_PROVIDER_SITE_OTHER): Payer: 59 | Admitting: Family Medicine

## 2018-10-26 ENCOUNTER — Encounter: Payer: Self-pay | Admitting: Family Medicine

## 2018-10-26 VITALS — HR 80 | Temp 97.6°F | Ht 63.0 in | Wt 130.0 lb

## 2018-10-26 DIAGNOSIS — F331 Major depressive disorder, recurrent, moderate: Secondary | ICD-10-CM

## 2018-10-26 DIAGNOSIS — F411 Generalized anxiety disorder: Secondary | ICD-10-CM

## 2018-10-26 NOTE — Progress Notes (Signed)
Virtual Visit via Video Note  I connected with Brenda Sutton on 10/26/18 at  8:50 AM EDT by a video enabled telemedicine application and verified that I am speaking with the correct person using two identifiers.   I discussed the limitations of evaluation and management by telemedicine and the availability of in person appointments. The patient expressed understanding and agreed to proceed.  Subjective:    CC: F/U Mood  HPI:  48 yo female was seen last month for CPE.  We discussed that her anxiety levels were quite high an increased her flouoxetine to 29m. She if following up medication change today. 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 Only need ot use the xanax a few times.  Her GM was recently diagnosed with COVID but has been doing OK so far. She has been running 3 miles per day and that has really helped her reduce her stress levels.     Past medical history, Surgical history, Family history not pertinant except as noted below, Social history, Allergies, and medications have been entered into the medical record, reviewed, and corrections made.   Review of Systems: Sutton fevers, chills, night sweats, weight loss, chest pain, or shortness of breath.   Objective:    General: Speaking clearly in complete sentences without any shortness of breath.  Alert and oriented x3.  Normal judgment. Sutton apparent acute distress. Well roomed.    Impression and Recommendations:    GAD/Depression - PHQ -9 score of 3, down from 8.  The GAD-7 core of 3, down from 8.  She has been running for exrecise. Sutton side effects. Currently.  Will stay at current dose.  She has only taken 2 tabs of xanax.       I discussed the assessment and treatment plan with the patient. The patient was provided an opportunity to ask questions and all were answered. The patient agreed with the plan and demonstrated an understanding of the instructions.   The patient was advised to call back or  seek an in-person evaluation if the symptoms worsen or if the condition fails to improve as anticipated.   CBeatrice Lecher MD

## 2018-11-05 ENCOUNTER — Ambulatory Visit (INDEPENDENT_AMBULATORY_CARE_PROVIDER_SITE_OTHER): Payer: 59 | Admitting: Family Medicine

## 2018-11-05 ENCOUNTER — Encounter: Payer: Self-pay | Admitting: Family Medicine

## 2018-11-05 VITALS — BP 110/61 | HR 70 | Temp 98.8°F | Ht 63.0 in | Wt 136.0 lb

## 2018-11-05 DIAGNOSIS — H9203 Otalgia, bilateral: Secondary | ICD-10-CM

## 2018-11-05 NOTE — Progress Notes (Signed)
Acute Office Visit  Subjective:    Patient ID: Brenda Sutton, female    DOB: February 20, 1971, 48 y.o.   MRN: [id number]  Chief Complaint  Patient presents with  . Ear Pain    bilateral x 2 wks she stated that the pain is intermittent she has tried loratadine and it has helped some denies f/s/c but does feel that her lymph nodes feel a little swollen     HPI Patient is in today for bilateral ear pain.  She says it seems to come and go but when it hurts it usually both ears at the same time.  She denies any recent upper respiratory symptoms.  No sore throat or postnasal drip.  She does have seasonal allergies and has been taking over-the-counter loratadine.  She denies any drainage from the ears fevers or chills.  Symptoms have been present for going on about 3 weeks.  It has not stopped her from exercising.  She denies any hearing loss.  He has noticed at times that her lymph node glands feel swollen in her neck.  She denies any facial pressure pain.  Past Medical History:  Diagnosis Date  . Acute cystitis   . Anemia    h/o  . Depression   . Environmental allergies    grass   . Right ovarian cyst   . Suicide attempt ([place])    at age 48  . UTI (urinary tract infection)     Past Surgical History:  Procedure Laterality Date  .  tear duct   age 27   minor injury from toy had surgery to repair ; no issues post op   . ROBOTIC ASSISTED BILATERAL SALPINGO OOPHERECTOMY Left 08/18/2017   Procedure: XI ROBOTIC ASSISTED LEFT SALPINGO OOPHORECTOMY;  Surgeon: Everitt Amber, MD;  Location: [facility];  Service: Gynecology;  Laterality: Left;  . TONSILLECTOMY  age 70    childhood     Family History  Problem Relation Age of Onset  . Lymphoma Father        Non hodgkins   . Depression Unknown     Social History   Socioeconomic History  . Marital status: Single    Spouse name: Not on file  . Number of children: Not on file  . Years of education: Not on file  . Highest education level: Not on  file  Occupational History  . Not on file  Social Needs  . Financial resource strain: Not on file  . Food insecurity:    Worry: Not on file    Inability: Not on file  . Transportation needs:    Medical: Not on file    Non-medical: Not on file  Tobacco Use  . Smoking status: Never Smoker  . Smokeless tobacco: Never Used  Substance and Sexual Activity  . Alcohol use: Yes    Alcohol/week: 2.0 standard drinks    Types: 2 Standard drinks or equivalent per week  . Drug use: Yes    Types: Marijuana    Comment: denies   . Sexual activity: Not on file  Lifestyle  . Physical activity:    Days per week: Not on file    Minutes per session: Not on file  . Stress: Not on file  Relationships  . Social connections:    Talks on phone: Not on file    Gets together: Not on file    Attends religious service: Not on file    Active member of club or organization: Not on file  Attends meetings of clubs or organizations: Not on file    Relationship status: Not on file  . Intimate partner violence:    Fear of current or ex partner: Not on file    Emotionally abused: Not on file    Physically abused: Not on file    Forced sexual activity: Not on file  Other Topics Concern  . Not on file  Social History Narrative   No regular exercise.     Outpatient Medications Prior to Visit  Medication Sig Dispense Refill  . ALPRAZolam (XANAX) 0.25 MG tablet Take 1-2 tablets (0.25-0.5 mg total) by mouth 2 (two) times daily as needed for anxiety. 20 tablet 0  . FLUoxetine (PROZAC) 40 MG capsule Take 1 capsule (40 mg total) by mouth daily. TAKE 1 CAPSULE BY MOUTH EVERY DAY AT BEDTIME. 90 capsule 1  . fluticasone (FLONASE) 50 MCG/ACT nasal spray Place 1 spray into both nostrils daily as needed (for allergies.).      No facility-administered medications prior to visit.     Allergies  Allergen Reactions  . Erythromycin Nausea And Vomiting    ROS     Objective:    Physical Exam  Constitutional:  She is oriented to person, place, and time. She appears well-developed and well-nourished.  HENT:  Head: Normocephalic and atraumatic.  Right Ear: External ear normal.  Left Ear: External ear normal.  Nose: Nose normal.  Mouth/Throat: Oropharynx is clear and moist.  TMs and canals are clear.   Eyes: Pupils are equal, round, and reactive to light. Conjunctivae and EOM are normal.  Neck: Neck supple. No thyromegaly present.  Has some mildly tender bilateral anterior cervical lymph nodes.  But they are not abnormally enlarged.  Cardiovascular: Normal rate, regular rhythm and normal heart sounds.  Pulmonary/Chest: Effort normal and breath sounds normal. She has no wheezes.  Lymphadenopathy:    She has cervical adenopathy.  Neurological: She is alert and oriented to person, place, and time.  Skin: Skin is warm and dry.  Psychiatric: She has a normal mood and affect.    BP 110/61   Pulse 70   Temp 98.8 F (37.1 C)   Ht 5' 3"  (1.6 m)   Wt 136 lb (61.7 kg)   SpO2 94%   BMI 24.09 kg/m  Wt Readings from Last 3 Encounters:  11/05/18 136 lb (61.7 kg)  10/26/18 130 lb (59 kg)  09/28/18 133 lb (60.3 kg)    There are no preventive care reminders to display for this patient.  There are no preventive care reminders to display for this patient.   Lab Results  Component Value Date   TSH 3.86 01/07/2016   Lab Results  Component Value Date   WBC 3.7 (L) 08/13/2017   HGB 12.7 08/13/2017   HCT 37.1 08/13/2017   MCV 90.5 08/13/2017   PLT 329 08/13/2017   Lab Results  Component Value Date   NA 136 08/13/2017   K 4.2 08/13/2017   CO2 27 08/13/2017   GLUCOSE 92 08/13/2017   BUN 12 08/13/2017   CREATININE 0.74 08/13/2017   BILITOT 0.8 08/13/2017   ALKPHOS 35 (L) 08/13/2017   AST 18 08/13/2017   ALT 16 08/13/2017   PROT 6.5 08/13/2017   ALBUMIN 3.9 08/13/2017   CALCIUM 8.8 (L) 08/13/2017   ANIONGAP 3 (L) 08/13/2017   Lab Results  Component Value Date   CHOL 144 01/07/2016    Lab Results  Component Value Date   HDL 69 01/07/2016  Lab Results  Component Value Date   LDLCALC 66 01/07/2016   Lab Results  Component Value Date   TRIG 45 01/07/2016   Lab Results  Component Value Date   CHOLHDL 2.1 01/07/2016   No results found for: HGBA1C     Assessment & Plan:   Problem List Items Addressed This Visit    None    Visit Diagnoses    Acute ear pain, bilateral    -  Primary     Lateral ear pain-most consistent with eustachian tube dysfunction as her ear exam itself is normal.  She had a little bit of tenderness over the anterior lymph nodes but they were not abnormally swollen.  Recommend that she use a nasal steroid spray.  She says she thinks she has some Flonase Sensimist at home.  She is not a big fan of using nasal sprays but says she is willing to try it for the next couple of weeks.  Reminded her that it can take several days often times up to 5 days to see efficacy.  If at that point she is not at least starting to improve, notices worsening pain, sudden fever, facial pressure or pain indicating more of a sinus infection or drainage from the ears then please let us know.  No orders of the defined types were placed in this encounter.    Beatrice Lecher, MD

## 2018-11-24 ENCOUNTER — Other Ambulatory Visit: Payer: Self-pay

## 2018-11-24 ENCOUNTER — Ambulatory Visit (INDEPENDENT_AMBULATORY_CARE_PROVIDER_SITE_OTHER): Payer: 59

## 2018-11-24 DIAGNOSIS — Z1231 Encounter for screening mammogram for malignant neoplasm of breast: Secondary | ICD-10-CM

## 2018-12-15 ENCOUNTER — Telehealth: Payer: Self-pay

## 2018-12-15 DIAGNOSIS — J029 Acute pharyngitis, unspecified: Secondary | ICD-10-CM

## 2018-12-15 DIAGNOSIS — H9203 Otalgia, bilateral: Secondary | ICD-10-CM

## 2018-12-15 NOTE — Telephone Encounter (Signed)
Referral placed, patient is aware.

## 2018-12-15 NOTE — Telephone Encounter (Signed)
Okay, sounds good.  Please place referral to ENT here in [place] for her and let her know our plan.  Thank you.

## 2018-12-15 NOTE — Telephone Encounter (Signed)
Patient called as a follow up from her appt early May. States she has tried [facility] and multiple OTC allergy medications and has not had any relief from ear pain and throat discomfort.  Patient states at her appt Dr Madilyn Fireman had mentioned possibly "allergy shot" and patient is ready to move forward with that. Please advise

## 2019-01-08 IMAGING — US US PELVIS COMPLETE TRANSABD/TRANSVAG
1 series · 13 of 25 positions shown · non-contrast
Comparison: 08/27/2016

CLINICAL DATA: Follow-up right ovarian cyst



[Series 1: us pelvis complete transabd/transvag · 0.15mm/px · 13 of 90 slices shown]
[im 1/90]
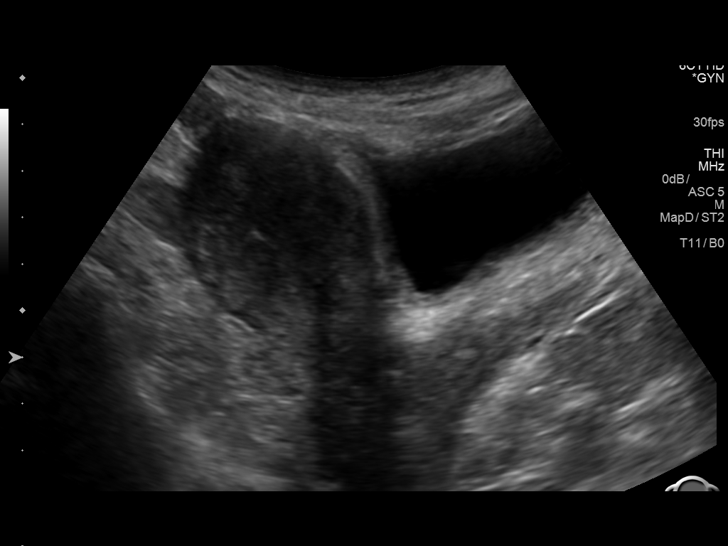
[im 8/90]
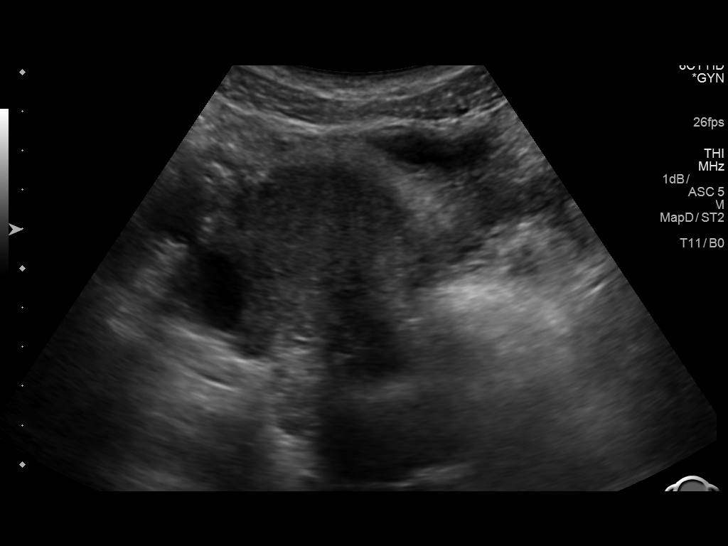
[im 15/90]
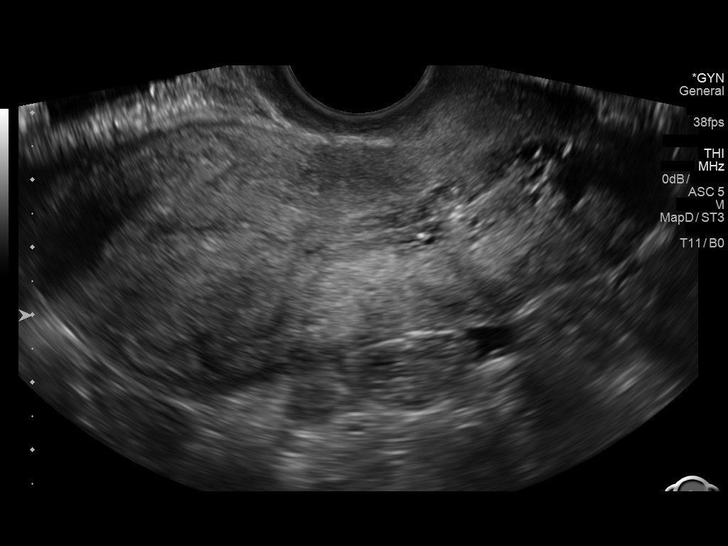
[im 23/90]
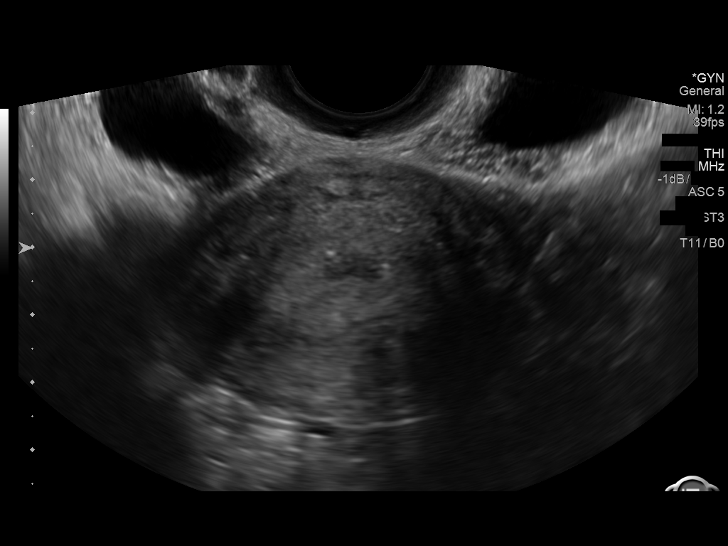
[im 30/90]
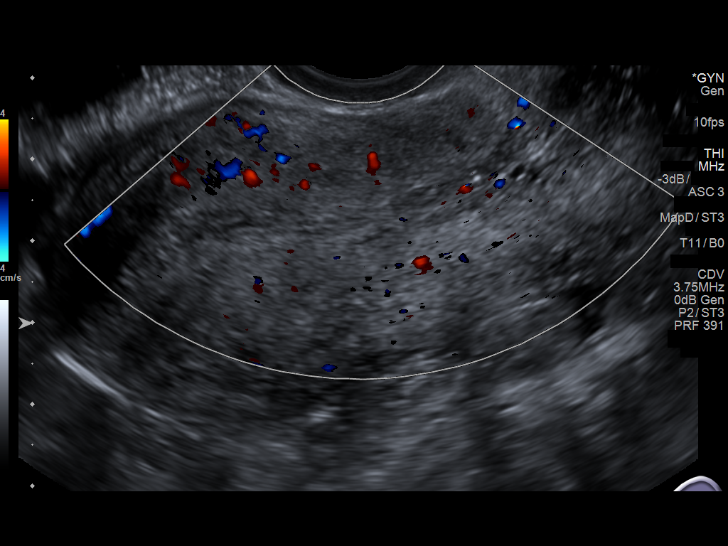
[im 38/90]
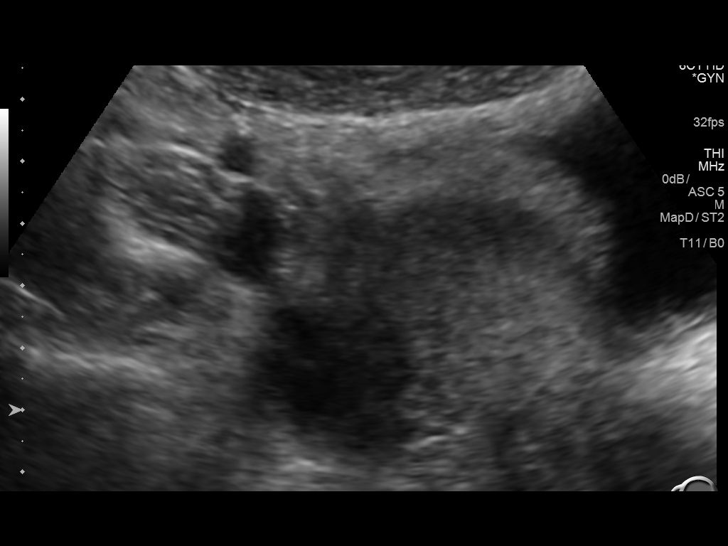
[im 45/90]
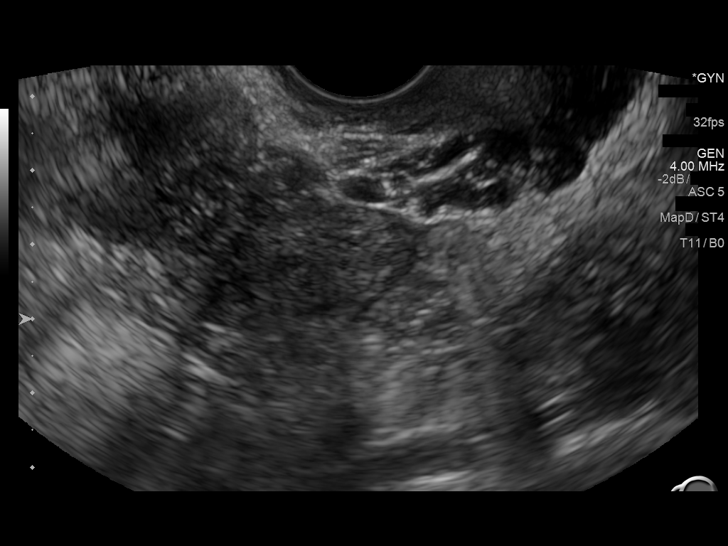
[im 52/90]
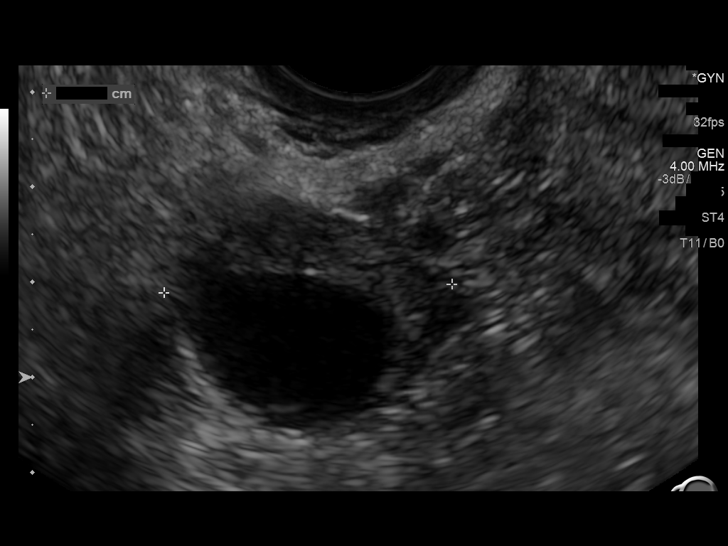
[im 60/90]
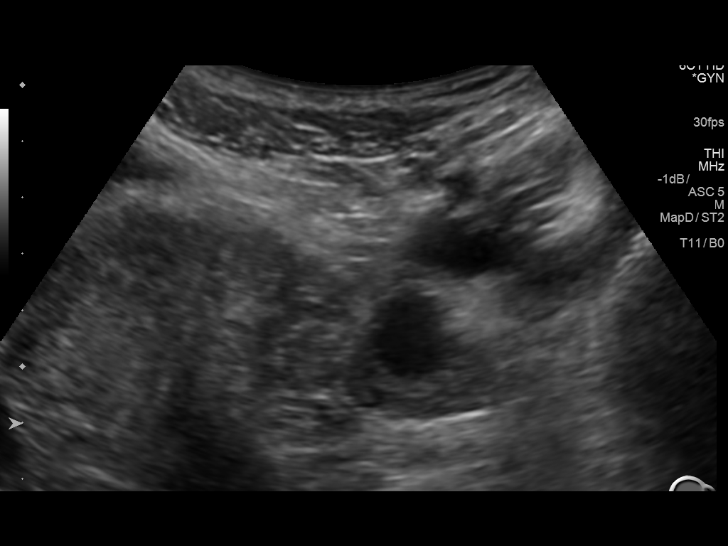
[im 67/90]
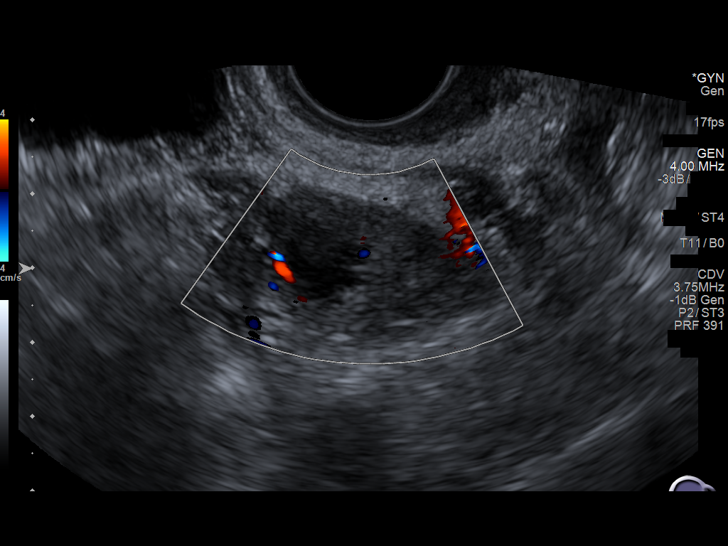
[im 75/90]
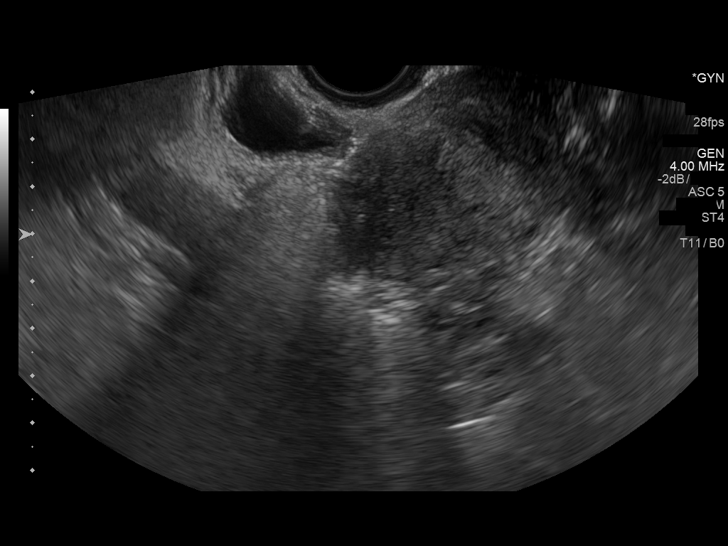
[im 82/90]
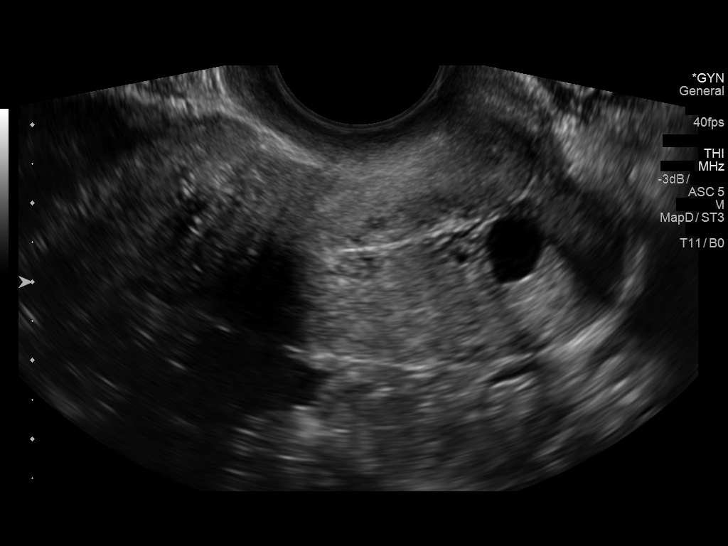
[im 90/90]
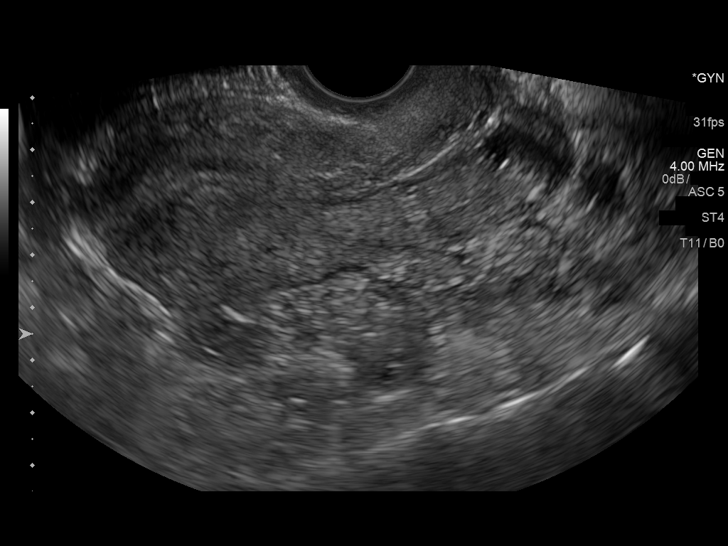

[13 of 25 positions shown; findings below may reference images not displayed]

FINDINGS: Uterus

Measurements: 8.9 x 4.0 x 4.8 cm. There are cystic areas within the
lower uterine segment and cervix likely representing nabothian
cysts. One of these is somewhat complex in nature of uncertain
significance. It measures approximately 7 mm It was not as well
appreciated on the prior exam.

Endometrium

Thickness: 7 mm.  No focal abnormality visualized.

Right ovary

Measurements: 3.4 x 2.5 x 3.0 cm.. Cystic lesion with internal
septations is again noted within the ovary. The degree of
echogenicity within has improved from the prior exam most consistent
with a hemorrhagic cyst.

Left ovary

Measurements: 3.4 x 2.0 x 2.6 cm.. Normal appearance/no adnexal
mass.

Other findings

No abnormal free fluid.
IMPRESSION: Cystic areas in the lower uterine segment likely representing
nabothian cysts. One of these is somewhat complex with internal
echogenicities better visualized on the current exam. Short-term
follow-up is recommended in 6-12 weeks for further evaluation.

Previously seen right ovarian cystic lesion now demonstrates a
single septation although the echogenicity seen previously has
improved significantly likely representing resolving hemorrhagic
cyst.

No other focal abnormality is noted.

## 2019-03-24 ENCOUNTER — Other Ambulatory Visit: Payer: Self-pay | Admitting: Family Medicine

## 2019-03-24 DIAGNOSIS — F411 Generalized anxiety disorder: Secondary | ICD-10-CM

## 2019-03-24 DIAGNOSIS — F331 Major depressive disorder, recurrent, moderate: Secondary | ICD-10-CM

## 2019-09-21 ENCOUNTER — Telehealth: Payer: Self-pay

## 2019-09-21 NOTE — Telephone Encounter (Signed)
I really like to be able to see it to see if it something that we can take care of here in the office or if it needs to be seen by dermatology.  Is there any way she can send a photograph through [place]?

## 2019-09-21 NOTE — Telephone Encounter (Signed)
Pt called stating that she has a "growth" on her chest that she did not have 6 months ago.  Pt requesting referral to derm for evaluation and possible biopsy.  Charyl Bigger, CMA

## 2019-09-21 NOTE — Telephone Encounter (Signed)
Pt given appt for 09/26/2019 at 10:10am.  Charyl Bigger, CMA

## 2019-09-26 ENCOUNTER — Other Ambulatory Visit: Payer: Self-pay

## 2019-09-26 ENCOUNTER — Encounter: Payer: Self-pay | Admitting: Family Medicine

## 2019-09-26 ENCOUNTER — Ambulatory Visit (INDEPENDENT_AMBULATORY_CARE_PROVIDER_SITE_OTHER): Payer: 59 | Admitting: Family Medicine

## 2019-09-26 ENCOUNTER — Other Ambulatory Visit: Payer: Self-pay | Admitting: Family Medicine

## 2019-09-26 VITALS — BP 97/51 | HR 59 | Ht 63.0 in | Wt 131.0 lb

## 2019-09-26 DIAGNOSIS — L989 Disorder of the skin and subcutaneous tissue, unspecified: Secondary | ICD-10-CM

## 2019-09-26 NOTE — Progress Notes (Signed)
Established Patient Office Visit  Subjective:  Patient ID: Brenda Sutton, female    DOB: 1971/01/17  Age: 49 y.o. MRN: [id number]  CC:  Chief Complaint  Patient presents with  . Nevus    UPPER R SIDE OF CHEST    HPI Brenda Sutton presents for skin lesion on right upper chest.  She noticed this 1 month ago. She states that it is not painful. It does get itchy when she thinks about it. This weekend she scrubbed at the area and says she was able to get some of the surface off of it.  No prior history of skin cancer.   Past Medical History:  Diagnosis Date  . Acute cystitis   . Anemia    h/o  . Depression   . Environmental allergies    grass   . Right ovarian cyst   . Suicide attempt ([place])    at age 7  . UTI (urinary tract infection)     Past Surgical History:  Procedure Laterality Date  .  tear duct   age 56   minor injury from toy had surgery to repair ; no issues post op   . ROBOTIC ASSISTED BILATERAL SALPINGO OOPHERECTOMY Left 08/18/2017   Procedure: XI ROBOTIC ASSISTED LEFT SALPINGO OOPHORECTOMY;  Surgeon: Everitt Amber, MD;  Location: [facility];  Service: Gynecology;  Laterality: Left;  . TONSILLECTOMY  age 34    childhood     Family History  Problem Relation Age of Onset  . Lymphoma Father        Non hodgkins   . Depression Other     Social History   Socioeconomic History  . Marital status: Single    Spouse name: Not on file  . Number of children: Not on file  . Years of education: Not on file  . Highest education level: Not on file  Occupational History  . Not on file  Tobacco Use  . Smoking status: Never Smoker  . Smokeless tobacco: Never Used  Substance and Sexual Activity  . Alcohol use: Yes    Alcohol/week: 2.0 standard drinks    Types: 2 Standard drinks or equivalent per week  . Drug use: Yes    Types: Marijuana    Comment: denies   . Sexual activity: Not on file  Other Topics Concern  . Not on file  Social History Narrative   No  regular exercise.    Social Determinants of Health   Financial Resource Strain:   . Difficulty of Paying Living Expenses:   Food Insecurity:   . Worried About Charity fundraiser in the Last Year:   . Arboriculturist in the Last Year:   Transportation Needs:   . Film/video editor (Medical):   Marland Kitchen Lack of Transportation (Non-Medical):   Physical Activity:   . Days of Exercise per Week:   . Minutes of Exercise per Session:   Stress:   . Feeling of Stress :   Social Connections:   . Frequency of Communication with Friends and Family:   . Frequency of Social Gatherings with Friends and Family:   . Attends Religious Services:   . Active Member of Clubs or Organizations:   . Attends Archivist Meetings:   Marland Kitchen Marital Status:   Intimate Partner Violence:   . Fear of Current or Ex-Partner:   . Emotionally Abused:   Marland Kitchen Physically Abused:   . Sexually Abused:     Outpatient Medications Prior  to Visit  Medication Sig Dispense Refill  . ALPRAZolam (XANAX) 0.25 MG tablet Take 1-2 tablets (0.25-0.5 mg total) by mouth 2 (two) times daily as needed for anxiety. 20 tablet 0  . FLUoxetine (PROZAC) 40 MG capsule Take 1 capsule (40 mg total) by mouth daily. TAKE 1 CAPSULE BY MOUTH EVERY DAY AT BEDTIME. NEEDS APPT 90 capsule 0  . fluticasone (FLONASE) 50 MCG/ACT nasal spray Place 1 spray into both nostrils daily as needed (for allergies.).      No facility-administered medications prior to visit.    Allergies  Allergen Reactions  . Erythromycin Nausea And Vomiting    ROS Review of Systems    Objective:    Physical Exam  Constitutional: She is oriented to person, place, and time. She appears well-developed and well-nourished.  HENT:  Head: Normocephalic and atraumatic.  Eyes: Conjunctivae and EOM are normal.  Cardiovascular: Normal rate.  Pulmonary/Chest: Effort normal.  Neurological: She is alert and oriented to person, place, and time.  Skin: Skin is dry. No pallor.   Lesion on her right upper chest is oval-shaped and mildly erythematous.  Underneath the microscope it has a shiny appearance with some drying of the skin. approx 3 mm  Psychiatric: She has a normal mood and affect. Her behavior is normal.  Vitals reviewed.   BP (!) 97/51   Pulse (!) 59   Ht 5' 3"  (1.6 m)   Wt 131 lb (59.4 kg)   SpO2 100%   BMI 23.21 kg/m  Wt Readings from Last 3 Encounters:  09/26/19 131 lb (59.4 kg)  11/05/18 136 lb (61.7 kg)  10/26/18 130 lb (59 kg)     There are no preventive care reminders to display for this patient.  There are no preventive care reminders to display for this patient.  Lab Results  Component Value Date   TSH 3.86 01/07/2016   Lab Results  Component Value Date   WBC 3.7 (L) 08/13/2017   HGB 12.7 08/13/2017   HCT 37.1 08/13/2017   MCV 90.5 08/13/2017   PLT 329 08/13/2017   Lab Results  Component Value Date   NA 136 08/13/2017   K 4.2 08/13/2017   CO2 27 08/13/2017   GLUCOSE 92 08/13/2017   BUN 12 08/13/2017   CREATININE 0.74 08/13/2017   BILITOT 0.8 08/13/2017   ALKPHOS 35 (L) 08/13/2017   AST 18 08/13/2017   ALT 16 08/13/2017   PROT 6.5 08/13/2017   ALBUMIN 3.9 08/13/2017   CALCIUM 8.8 (L) 08/13/2017   ANIONGAP 3 (L) 08/13/2017   Lab Results  Component Value Date   CHOL 144 01/07/2016   Lab Results  Component Value Date   HDL 69 01/07/2016   Lab Results  Component Value Date   LDLCALC 66 01/07/2016   Lab Results  Component Value Date   TRIG 45 01/07/2016   Lab Results  Component Value Date   CHOLHDL 2.1 01/07/2016   No results found for: HGBA1C    Assessment & Plan:   Problem List Items Addressed This Visit    None    Visit Diagnoses    Skin lesion of chest wall    -  Primary   Relevant Orders   Surgical pathology     Skin lesion of right upper chest wall-underneath the microscope it does have a more shiny appearance.  Possible basal cell versus just reactive from scrubbing added over the  weekend.  Also consider inflamed seborrheic keratoses.  Recommend shave biopsy for more  definitive diagnoses she is very fair skinned and had a significant on a sun exposure as a child. Call with results once available.  Follow-up wound care discussed.  Shave Biopsy Procedure Note  Pre-operative Diagnosis: Suspicious lesion  Post-operative Diagnosis: same  Locations:right upper chest  Indications: new lesion, itching  Anesthesia: Lidocaine 1% without epinephrine without added sodium bicarbonate  Procedure Details  Patient informed of the risks (including bleeding and infection) and benefits of the  procedure and Verbal informed consent obtained.  The lesion and surrounding area were given a sterile prep using chlorhexidine and draped in the usual sterile fashion. A scalpel was used to shave an area of skin approximately 56m by 536m  Hemostasis achieved with alumuninum chloride. Antibiotic ointment and a sterile dressing applied.  The specimen was sent for pathologic examination. The patient tolerated the procedure well.  EBL: trace blood  Findings: Await pathology  Condition: Stable  Complications: none.  Plan: 1. Instructed to keep the wound dry and covered for 24-48h and clean thereafter. 2. Warning signs of infection were reviewed.   3. Recommended that the patient use OTC acetaminophen as needed for pain.  4. Return PRN.   No orders of the defined types were placed in this encounter.   Follow-up: Return if symptoms worsen or fail to improve.    CaBeatrice LecherMD

## 2019-09-26 NOTE — Patient Instructions (Signed)
Keep covered until tomorrow morning if possible.  Okay to get wet in the shower.  Do not apply any alcohol or peroxide type products.  Just gently soap with fingertips in the shower and then pat dry.  Apply a small dab of Vaseline and keep very loosely covered with a Band-Aid for the next 2 days.  After that just apply Vaseline and do not need to cover it.

## 2019-09-26 NOTE — Progress Notes (Signed)
She noticed this 1 month ago. She states that it is not painful. It does get itchy when she thinks about it.

## 2019-10-24 ENCOUNTER — Other Ambulatory Visit: Payer: Self-pay | Admitting: Family Medicine

## 2019-10-24 DIAGNOSIS — F331 Major depressive disorder, recurrent, moderate: Secondary | ICD-10-CM

## 2019-10-24 DIAGNOSIS — F411 Generalized anxiety disorder: Secondary | ICD-10-CM

## 2020-04-23 ENCOUNTER — Other Ambulatory Visit: Payer: Self-pay | Admitting: Family Medicine

## 2020-04-23 DIAGNOSIS — F411 Generalized anxiety disorder: Secondary | ICD-10-CM

## 2020-04-23 DIAGNOSIS — F331 Major depressive disorder, recurrent, moderate: Secondary | ICD-10-CM

## 2020-09-10 ENCOUNTER — Other Ambulatory Visit: Payer: Self-pay | Admitting: Family Medicine

## 2020-09-10 DIAGNOSIS — F411 Generalized anxiety disorder: Secondary | ICD-10-CM

## 2020-09-10 DIAGNOSIS — F331 Major depressive disorder, recurrent, moderate: Secondary | ICD-10-CM

## 2021-08-15 ENCOUNTER — Telehealth: Payer: Self-pay

## 2021-08-15 ENCOUNTER — Ambulatory Visit: Payer: No Typology Code available for payment source | Admitting: Family Medicine

## 2021-08-15 NOTE — Telephone Encounter (Signed)
disregard

## 2021-10-26 ENCOUNTER — Other Ambulatory Visit: Payer: Self-pay | Admitting: Family Medicine

## 2021-10-26 DIAGNOSIS — F331 Major depressive disorder, recurrent, moderate: Secondary | ICD-10-CM

## 2021-10-26 DIAGNOSIS — F411 Generalized anxiety disorder: Secondary | ICD-10-CM

## 2021-11-13 ENCOUNTER — Encounter: Payer: Self-pay | Admitting: Family Medicine

## 2021-11-13 ENCOUNTER — Ambulatory Visit (INDEPENDENT_AMBULATORY_CARE_PROVIDER_SITE_OTHER): Payer: No Typology Code available for payment source | Admitting: Family Medicine

## 2021-11-13 VITALS — BP 112/64 | HR 65 | Resp 16 | Ht 63.0 in | Wt 141.0 lb

## 2021-11-13 DIAGNOSIS — F411 Generalized anxiety disorder: Secondary | ICD-10-CM

## 2021-11-13 DIAGNOSIS — N951 Menopausal and female climacteric states: Secondary | ICD-10-CM

## 2021-11-13 DIAGNOSIS — Z1211 Encounter for screening for malignant neoplasm of colon: Secondary | ICD-10-CM | POA: Diagnosis not present

## 2021-11-13 DIAGNOSIS — T7840XA Allergy, unspecified, initial encounter: Secondary | ICD-10-CM

## 2021-11-13 DIAGNOSIS — J302 Other seasonal allergic rhinitis: Secondary | ICD-10-CM

## 2021-11-13 MED ORDER — ALPRAZOLAM 0.25 MG PO TABS: 0.2500 mg | ORAL_TABLET | Freq: Every day | ORAL | 0 refills | Status: DC | PRN

## 2021-11-13 MED ORDER — SERTRALINE HCL 50 MG PO TABS: ORAL_TABLET | ORAL | 1 refills | Status: DC

## 2021-11-13 NOTE — Progress Notes (Signed)
? ?Established Patient Office Visit ? ?Subjective   ?Patient ID: Brenda Sutton, female    DOB: Dec 19, 1970  Age: 51 y.o. MRN: [id number] ? ?Chief Complaint  ?Patient presents with  ? Anxiety  ?  Patient stopped Prozac several months ago due to insomnia. Patient would like to discuss Zoloft   ? Allergies  ?  Patient would like to discuss referral to Allergist   ? Hot Flashes  ?  Nigh sweats   ? ? ?HPI ? ?F/U Anxiety -she actually stopped Prozac on her own several months ago she felt like it was contributing to some insomnia.  She would like to discuss the option of maybe a trial of Zoloft.  She is engaged in therapy/counseling.  She works out regularly and eats a healthy diet. ? ?She also feels like she has been having some allergies and would like referral to an allergist.  She has pretty significant seasonal allergies most the time she takes either loratadine or fexofenadine.  She has tried the nasal sprays but finds that she is not always consistent with them.  About 4 5 years ago she went to see an allergist and had started immunotherapy but has to travel a lot to visit family up [place] and help take care of them so was unable to continue them.  She would like a new referral back to an allergist.  She often gets dry itchy throat, cough, and nasal congestion and headaches. ? ?Is also having concerns about hot flashes/night with sweats.  Flashes have been present for a little over a year.  Her last period was a little over a year.  She exercises regularly and eats healthy.  She can go sometimes days without them but then they will recur. ? ? ? ?ROS ? ?  ?Objective:  ?  ? ?BP 112/64   Pulse 65   Resp 16   Ht 5' 3"  (1.6 m)   Wt 141 lb (64 kg)   LMP 07/26/2017 (LMP Unknown)   SpO2 98%   BMI 24.98 kg/m?  ? ? ?Physical Exam ?Vitals and nursing note reviewed.  ?Constitutional:   ?   Appearance: She is well-developed.  ?HENT:  ?   Head: Normocephalic and atraumatic.  ?Cardiovascular:  ?   Rate and Rhythm: Normal  rate and regular rhythm.  ?   Heart sounds: Normal heart sounds.  ?Pulmonary:  ?   Effort: Pulmonary effort is normal.  ?   Breath sounds: Normal breath sounds.  ?Musculoskeletal:  ?   Cervical back: Neck supple.  ?Lymphadenopathy:  ?   Cervical: No cervical adenopathy.  ?Skin: ?   General: Skin is warm and dry.  ?Neurological:  ?   Mental Status: She is alert and oriented to person, place, and time.  ?Psychiatric:     ?   Behavior: Behavior normal.  ? ? ?No results found for any visits on 11/13/21. ? ? ? ?The ASCVD Risk score (Arnett DK, et al., 2019) failed to calculate for the following reasons: ?  Cannot find a previous HDL lab ?  Cannot find a previous total cholesterol lab ? ?  ?Assessment & Plan:  ? ?Problem List Items Addressed This Visit   ? ?  ? Cardiovascular and Mediastinum  ? Hot flash, menopausal  ?  Discussed that SSRIs often times can help improve hot flashes though it does not completely resolve them.  Would like to see if the sertraline is helpful.  She is already eating healthy and  exercising regularly which is also helpful.  Can consider hormone replacement if needed and if symptoms progress or become more severe. ? ?  ?  ?  ? Other  ? Seasonal allergies  ?  We will place new referral to [facility] to discuss possible immunotherapy.  She knows that she has very specific grass, tree and mold allergies ? ?  ?  ? GAD (generalized anxiety disorder) - Primary  ?  Discussed options. She does state and sertraline which I think would actually be a great choice.  She has a family member who takes it and does well with it.  We will start with half a tab of the 50 mg tablet for 10 days and then go up to a whole tab.  Follow-up in 6 weeks.  We will see how she is doing at that point in time.  PHQ-9 score of 12 and GAD-7 score of 12 today.  Did go ahead and refill alprazolam for just as needed use.  Is actually been about 2 and half years since we last filled it. ? ?  ?  ? Relevant Medications  ?  ALPRAZolam (XANAX) 0.25 MG tablet  ? sertraline (ZOLOFT) 50 MG tablet  ? ?Other Visit Diagnoses   ? ? Allergy, initial encounter      ? Relevant Orders  ? Ambulatory referral to Allergy  ? Colon cancer screening      ? Relevant Orders  ? Cologuard  ? ?  ? ? ?Return in about 6 weeks (around 12/25/2021) for New start medication.  ? ? ?Beatrice Lecher, MD ? ?

## 2021-11-13 NOTE — Assessment & Plan Note (Signed)
Discussed options. She does state and sertraline which I think would actually be a great choice.  She has a family member who takes it and does well with it.  We will start with half a tab of the 50 mg tablet for 10 days and then go up to a whole tab.  Follow-up in 6 weeks.  We will see how she is doing at that point in time.  PHQ-9 score of 12 and GAD-7 score of 12 today.  Did go ahead and refill alprazolam for just as needed use.  Is actually been about 2 and half years since we last filled it. ?

## 2021-11-13 NOTE — Assessment & Plan Note (Signed)
We will place new referral to [facility] to discuss possible immunotherapy.  She knows that she has very specific grass, tree and mold allergies ?

## 2021-11-13 NOTE — Assessment & Plan Note (Signed)
Discussed that SSRIs often times can help improve hot flashes though it does not completely resolve them.  Would like to see if the sertraline is helpful.  She is already eating healthy and exercising regularly which is also helpful.  Can consider hormone replacement if needed and if symptoms progress or become more severe. ?

## 2021-12-10 ENCOUNTER — Other Ambulatory Visit: Payer: Self-pay | Admitting: Family Medicine

## 2021-12-26 ENCOUNTER — Encounter: Payer: Self-pay | Admitting: Family Medicine

## 2021-12-26 ENCOUNTER — Telehealth (INDEPENDENT_AMBULATORY_CARE_PROVIDER_SITE_OTHER): Payer: No Typology Code available for payment source | Admitting: Family Medicine

## 2021-12-26 DIAGNOSIS — N951 Menopausal and female climacteric states: Secondary | ICD-10-CM | POA: Diagnosis not present

## 2021-12-26 DIAGNOSIS — F411 Generalized anxiety disorder: Secondary | ICD-10-CM

## 2021-12-26 NOTE — Assessment & Plan Note (Signed)
She is still on a half a tab of the 50 mg sertraline she did try to go up temporarily but it was causing sleep disruption so she went back down to half.  She does plan on trying to go back up to a whole maybe in a couple of weeks.  She still has 1 more refill.  At that point when she runs out she can let me know if she ends up staying with 25 or 50 mg.  Otherwise plan to touch base again in about 4 months.

## 2021-12-26 NOTE — Progress Notes (Signed)
Pt doing well on current medication.  She report that she had a mild case of covid 1 week ago.

## 2021-12-26 NOTE — Progress Notes (Signed)
    Virtual Visit via Video Note  I connected with Brenda Sutton on 12/26/21 at  1:00 PM EDT by a video enabled telemedicine application and verified that I am speaking with the correct person using two identifiers.   I discussed the limitations of evaluation and management by telemedicine and the availability of in person appointments. The patient expressed understanding and agreed to proceed.  Patient location: at home Provider location: in office  Subjective:    CC:   Chief Complaint  Patient presents with   Anxiety    HPI: Follow-up generalized anxiety disorder-she had stopped her Prozac when I last saw her in May feeling like it was worsening her insomnia.  We decided to do a trial of sertraline.  He was also experiencing menopausal hot flashes. Pt doing well on current medication.   She report that she had a mild case of covid 1 week ago. she is feeling better.  Her throat is irritate.   Still having hot flashes.  She does not feel like the medication has been helpful in controlling those.  Almost 2 years since last period.   Past medical history, Surgical history, Family history not pertinant except as noted below, Social history, Allergies, and medications have been entered into the medical record, reviewed, and corrections made.    Objective:    General: Speaking clearly in complete sentences without any shortness of breath.  Alert and oriented x3.  Normal judgment. No apparent acute distress.    Impression and Recommendations:    Problem List Items Addressed This Visit       Cardiovascular and Mediastinum   Hot flash, menopausal    Sertraline at low-dose thus far has not been helpful.  We discussed HRT and potential benefits and risks with that medication including increased risk for breast cancer and cardiovascular disease etc.  She can certainly think about it and let me know.  She is about 2 years from her last period.  He does still have a uterus and 1  ovary.        Other   GAD (generalized anxiety disorder) - Primary    She is still on a half a tab of the 50 mg sertraline she did try to go up temporarily but it was causing sleep disruption so she went back down to half.  She does plan on trying to go back up to a whole maybe in a couple of weeks.  She still has 1 more refill.  At that point when she runs out she can let me know if she ends up staying with 25 or 50 mg.  Otherwise plan to touch base again in about 4 months.       No orders of the defined types were placed in this encounter.   No orders of the defined types were placed in this encounter.    I discussed the assessment and treatment plan with the patient. The patient was provided an opportunity to ask questions and all were answered. The patient agreed with the plan and demonstrated an understanding of the instructions.   The patient was advised to call back or seek an in-person evaluation if the symptoms worsen or if the condition fails to improve as anticipated.   Beatrice Lecher, MD

## 2021-12-26 NOTE — Assessment & Plan Note (Signed)
Sertraline at low-dose thus far has not been helpful.  We discussed HRT and potential benefits and risks with that medication including increased risk for breast cancer and cardiovascular disease etc.  She can certainly think about it and let me know.  She is about 2 years from her last period.  He does still have a uterus and 1 ovary.

## 2022-01-10 ENCOUNTER — Encounter: Payer: Self-pay | Admitting: Medical-Surgical

## 2022-01-10 ENCOUNTER — Ambulatory Visit (INDEPENDENT_AMBULATORY_CARE_PROVIDER_SITE_OTHER): Payer: No Typology Code available for payment source | Admitting: Medical-Surgical

## 2022-01-10 VITALS — BP 108/66 | HR 67 | Resp 20 | Ht 63.0 in | Wt 136.7 lb

## 2022-01-10 DIAGNOSIS — H6591 Unspecified nonsuppurative otitis media, right ear: Secondary | ICD-10-CM

## 2022-01-10 DIAGNOSIS — R300 Dysuria: Secondary | ICD-10-CM | POA: Diagnosis not present

## 2022-01-10 LAB — POCT URINALYSIS DIP (CLINITEK)
Bilirubin, UA: NEGATIVE
Glucose, UA: NEGATIVE mg/dL
Ketones, POC UA: NEGATIVE mg/dL
Leukocytes, UA: NEGATIVE
Nitrite, UA: NEGATIVE
POC PROTEIN,UA: NEGATIVE
Spec Grav, UA: 1.01 (ref 1.010–1.025)
Urobilinogen, UA: 0.2 E.U./dL
pH, UA: 6 (ref 5.0–8.0)

## 2022-01-10 MED ORDER — TRIAMCINOLONE ACETONIDE 55 MCG/ACT NA AERO: 2.0000 | INHALATION_SPRAY | Freq: Every day | NASAL | 11 refills | Status: DC

## 2022-01-10 MED ORDER — ESTRADIOL 0.1 MG/GM VA CREA: TOPICAL_CREAM | VAGINAL | 12 refills | Status: DC

## 2022-01-10 MED ORDER — AMOXICILLIN-POT CLAVULANATE 500-125 MG PO TABS: 1.0000 | ORAL_TABLET | Freq: Two times a day (BID) | ORAL | 0 refills | Status: DC

## 2022-01-10 NOTE — Progress Notes (Signed)
Established Patient Office Visit  Subjective   Patient ID: Brenda Sutton, female   DOB: 01/14/1971 Age: 51 y.o. MRN: [id number]   Chief Complaint  Patient presents with   Ear Pain    Right Ear Pain    HPI Pleasant 51 year old female presenting today for the following:   Ear concerns: Has had right ear pressure and discomfort with hearing loss for the past week. Notes that she has times where there is a whooshing sound in her ear along with an exho and vibration in the ear when talking. Recently had COVID about 3 weeks ago and her ear symptoms started 1-2 weeks after that. No fevers, chills, sore throat, unusual sinus congestion, or runny nose. Has tried Ibuprofen, no relief. Taking Allegra daily. Not using Flonase and did not tolerate it well in the past.   Has had some UTI type symptoms for the past several months, most notably urinary frequency. Describes this as mild and manageable. No urgency, burning, stinging, or suprapubic pain.    Objective:    Vitals:   01/10/22 1542  BP: 108/66  Pulse: 67  Resp: 20  Height: 5\' 3"  (1.6 m)  Weight: 136 lb 11.2 oz (62 kg)  SpO2: 97%  BMI (Calculated): 24.22   Physical Exam Vitals and nursing note reviewed.  Constitutional:      General: She is not in acute distress.    Appearance: Normal appearance. She is not ill-appearing.  HENT:     Head: Normocephalic and atraumatic.     Right Ear: External ear normal. Decreased hearing noted. A middle ear effusion (cloudy fluid) is present. There is no impacted cerumen. Tympanic membrane is bulging.     Left Ear: Tympanic membrane, ear canal and external ear normal. There is no impacted cerumen.  Cardiovascular:     Rate and Rhythm: Normal rate and regular rhythm.     Pulses: Normal pulses.     Heart sounds: Normal heart sounds.  Pulmonary:     Effort: Pulmonary effort is normal. No respiratory distress.     Breath sounds: Normal breath sounds. No wheezing, rhonchi or rales.  Skin:     General: Skin is warm and dry.  Neurological:     Mental Status: She is alert and oriented to person, place, and time.  Psychiatric:        Mood and Affect: Mood normal.        Behavior: Behavior normal.        Thought Content: Thought content normal.        Judgment: Judgment normal.    Results for orders placed or performed in visit on 01/10/22 (from the past 24 hour(s))  POCT URINALYSIS DIP (CLINITEK)     Status: Abnormal   Collection Time: 01/10/22  3:59 PM  Result Value Ref Range   Color, UA yellow yellow   Clarity, UA clear clear   Glucose, UA negative negative mg/dL   Bilirubin, UA negative negative   Ketones, POC UA negative negative mg/dL   Spec Grav, UA 03/13/22 4.196 - 1.025   Blood, UA small (A) negative   pH, UA 6.0 5.0 - 8.0   POC PROTEIN,UA negative negative, trace   Urobilinogen, UA 0.2 0.2 or 1.0 E.U./dL   Nitrite, UA Negative Negative   Leukocytes, UA Negative Negative       The ASCVD Risk score (Arnett DK, et al., 2019) failed to calculate for the following reasons:   Cannot find a previous HDL lab  Cannot find a previous total cholesterol lab   Assessment & Plan:   1. Dysuria POCT UA negative outside of a small amount of blood. Potential atrophic vaginitis. Adding estradiol cream topically to the vaginal area for 14 days then reduce to twice weekly. Discussed conservative measures to help prevent UTIs.  - POCT URINALYSIS DIP (CLINITEK)  2. Right otitis media with effusion Augmentin BID x 7 days. Continue Allegra 180mg  daily. Adding Nasacort 2 sprays each nostril daily.   Return if symptoms worsen or fail to improve. ___________________________________________ , DNP, APRN, FNP-BC Primary Care and Sports Medicine [facility]

## 2022-01-15 ENCOUNTER — Telehealth: Payer: Self-pay

## 2022-01-15 NOTE — Telephone Encounter (Signed)
Pt called left vm in regards to estrace  cream she was prescribed , pt states an applicator full seem like a lot of cream about 4 grams of medication  and wants to know  if that amount is correct.

## 2022-01-20 ENCOUNTER — Other Ambulatory Visit: Payer: Self-pay | Admitting: Family Medicine

## 2022-01-20 LAB — COLOGUARD: COLOGUARD: NEGATIVE

## 2022-01-20 NOTE — Progress Notes (Signed)
Great news! Your Cologuard test is negative.  Recommend repeat colon cancer screening in 3 years.

## 2022-01-21 ENCOUNTER — Other Ambulatory Visit: Payer: Self-pay | Admitting: Family Medicine

## 2022-01-21 ENCOUNTER — Other Ambulatory Visit: Payer: Self-pay | Admitting: *Deleted

## 2022-02-05 ENCOUNTER — Other Ambulatory Visit: Payer: Self-pay | Admitting: Family Medicine

## 2022-03-05 ENCOUNTER — Telehealth: Payer: Self-pay

## 2022-03-05 ENCOUNTER — Telehealth (INDEPENDENT_AMBULATORY_CARE_PROVIDER_SITE_OTHER): Payer: No Typology Code available for payment source | Admitting: Family Medicine

## 2022-03-05 DIAGNOSIS — N3000 Acute cystitis without hematuria: Secondary | ICD-10-CM | POA: Diagnosis not present

## 2022-03-05 MED ORDER — NITROFURANTOIN MONOHYD MACRO 100 MG PO CAPS: 100.0000 mg | ORAL_CAPSULE | Freq: Two times a day (BID) | ORAL | 0 refills | Status: DC

## 2022-03-05 NOTE — Telephone Encounter (Signed)
Pt called stating that she is currently out of [place] and began experiencing UTI symptoms 4 days ago.  Pt c/o urinary frequency, pelvic discomfort and cloudy urine.  She denies fever, chills, N&V and flank pain.  Pt is requesting an RX for an antibiotic.  Pt would like the RX.  Per Dr. Linford Arnold pt added for virtual visit now.   Tiajuana Amass, CMA

## 2022-03-05 NOTE — Progress Notes (Signed)
    Virtual Visit via Video Note  I connected with Brenda Sutton on 03/05/22 at  3:20 PM EDT by a video enabled telemedicine application and verified that I am speaking with the correct person using two identifiers.   I discussed the limitations of evaluation and management by telemedicine and the availability of in person appointments. The patient expressed understanding and agreed to proceed.  Patient location: at home Provider location: in office  Subjective:    CC:  No chief complaint on file.   HPI: Up in [place] and has frequency and urgency. No fever or nausea.  Has had some low back pain. + cloudy urin and has no blood in the urine. No recent ABX. No recent procedures or cath. No possible pregnancy.     Past medical history, Surgical history, Family history not pertinant except as noted below, Social history, Allergies, and medications have been entered into the medical record, reviewed, and corrections made.    Objective:    General: Speaking clearly in complete sentences without any shortness of breath.  Alert and oriented x3.  Normal judgment. No apparent acute distress.    Impression and Recommendations:    Problem List Items Addressed This Visit   None Visit Diagnoses     Acute cystitis without hematuria    -  Primary       Urinary tract infection-overall low risk.  Will treat with Macrobid.  If not better after the weekend then please let us know we will need to get additional specimen and culture.  She gets worse or develops fever nausea or vomiting then needs to be seen sooner.  She will be back in town on Saturday.  Continue to push fluids.  No orders of the defined types were placed in this encounter.   Meds ordered this encounter  Medications   nitrofurantoin, macrocrystal-monohydrate, (MACROBID) 100 MG capsule    Sig: Take 1 capsule (100 mg total) by mouth 2 (two) times daily.    Dispense:  10 capsule    Refill:  0     I discussed the  assessment and treatment plan with the patient. The patient was provided an opportunity to ask questions and all were answered. The patient agreed with the plan and demonstrated an understanding of the instructions.   The patient was advised to call back or seek an in-person evaluation if the symptoms worsen or if the condition fails to improve as anticipated.   Nani Gasser, MD

## 2022-05-13 ENCOUNTER — Other Ambulatory Visit: Payer: Self-pay | Admitting: Family Medicine

## 2022-06-10 ENCOUNTER — Encounter: Payer: Self-pay | Admitting: Emergency Medicine

## 2022-06-10 ENCOUNTER — Ambulatory Visit
Admission: EM | Admit: 2022-06-10 | Discharge: 2022-06-10 | Disposition: A | Discharge status: Home / Self Care | Payer: No Typology Code available for payment source | Attending: Family Medicine | Admitting: Family Medicine

## 2022-06-10 DIAGNOSIS — R102 Pelvic and perineal pain: Secondary | ICD-10-CM | POA: Insufficient documentation

## 2022-06-10 DIAGNOSIS — R319 Hematuria, unspecified: Secondary | ICD-10-CM | POA: Diagnosis not present

## 2022-06-10 DIAGNOSIS — M545 Low back pain, unspecified: Secondary | ICD-10-CM | POA: Insufficient documentation

## 2022-06-10 DIAGNOSIS — N3091 Cystitis, unspecified with hematuria: Secondary | ICD-10-CM | POA: Diagnosis not present

## 2022-06-10 DIAGNOSIS — N3090 Cystitis, unspecified without hematuria: Secondary | ICD-10-CM | POA: Diagnosis not present

## 2022-06-10 LAB — POCT URINALYSIS DIP (MANUAL ENTRY)
Bilirubin, UA: NEGATIVE
Glucose, UA: NEGATIVE mg/dL
Ketones, POC UA: NEGATIVE mg/dL
Nitrite, UA: NEGATIVE
Protein Ur, POC: >=300 mg/dL — AB
Spec Grav, UA: 1.01 (ref 1.010–1.025)
Urobilinogen, UA: 0.2 E.U./dL
pH, UA: 6.5 (ref 5.0–8.0)

## 2022-06-10 MED ORDER — CIPROFLOXACIN HCL 500 MG PO TABS: 500.0000 mg | ORAL_TABLET | Freq: Two times a day (BID) | ORAL | 0 refills | Status: DC

## 2022-06-10 NOTE — ED Provider Notes (Signed)
[facility]    CSN: [id number] Arrival date & time: 06/10/22  1639      History   Chief Complaint Chief Complaint  Patient presents with   Dysuria   Hematuria    HPI Brenda Sutton is a 51 y.o. female.   HPI  Patient states she think she has a bladder infection.  Started this morning.  She had blood in her urine.  Throughout the day she has progressively gotten worse.  This is worsening much more rapidly than her bladder infections usually do.  She is having low back pain, pelvic pain, chills as well as dysuria frequency and urgency.  Does not have flank pain.  Has not had nausea or vomiting.  Has not taken her temperature  Past Medical History:  Diagnosis Date   Acute cystitis    Anemia    h/o   Depression    Environmental allergies    grass    Right ovarian cyst    Suicide attempt (HCC)    at age 68   UTI (urinary tract infection)     Patient Active Problem List   Diagnosis Date Noted   Seasonal allergies 11/13/2021   Hot flash, menopausal 11/13/2021   Right ovarian cyst 08/18/2017   Routine general medical examination at a health care facility 01/11/2016   Metatarsophalangeal (joint) sprain 10/27/2013   Hypothyroidism 05/16/2013   BENIGN POSITIONAL VERTIGO 10/27/2008   Depression 07/30/2007   GAD (generalized anxiety disorder) 11/03/2006    Past Surgical History:  Procedure Laterality Date    tear duct   age 21   minor injury from toy had surgery to repair ; no issues post op    ROBOTIC ASSISTED BILATERAL SALPINGO OOPHERECTOMY Left 08/18/2017   Procedure: XI ROBOTIC ASSISTED LEFT SALPINGO OOPHORECTOMY;  Surgeon: Adolphus Birchwood, MD;  Location: [facility];  Service: Gynecology;  Laterality: Left;   TONSILLECTOMY  age 89    childhood     OB History   No obstetric history on file.      Home Medications    Prior to Admission medications   Medication Sig Start Date End Date Taking? Authorizing Provider  ALPRAZolam (XANAX) 0.25 MG tablet Take  1-2 tablets (0.25-0.5 mg total) by mouth daily as needed for anxiety. 11/13/21  Yes Agapito Games, MD  ciprofloxacin (CIPRO) 500 MG tablet Take 1 tablet (500 mg total) by mouth 2 (two) times daily. 06/10/22  Yes Eustace Moore, MD  estradiol (ESTRACE VAGINAL) 0.1 MG/GM vaginal cream Place 1 applicatorful to the vagina nightly for 14 days then reduce to twice weekly. 01/10/22  Yes Christen Butter, NP  sertraline (ZOLOFT) 50 MG tablet Take 1 tablet (50 mg total) by mouth daily. Needs appt for refills 05/13/22  Yes Agapito Games, MD  fexofenadine (ALLEGRA) 180 MG tablet Take 180 mg by mouth daily. 12/11/21   [provider]  triamcinolone (NASACORT) 55 MCG/ACT AERO nasal inhaler Place 2 sprays into the nose daily. 01/10/22   Christen Butter, NP    Family History Family History  Problem Relation Age of Onset   Lymphoma Father        Non hodgkins    Depression Other     Social History Social History   Tobacco Use   Smoking status: Never   Smokeless tobacco: Never  Vaping Use   Vaping Use: Never used  Substance Use Topics   Alcohol use: Yes    Alcohol/week: 2.0 standard drinks of alcohol  Types: 2 Standard drinks or equivalent per week   Drug use: Yes    Types: Marijuana    Comment: denies      Allergies   Erythromycin and Prozac [fluoxetine]   Review of Systems Review of Systems  See HPI Physical Exam Triage Vital Signs ED Triage Vitals  Enc Vitals Group     BP 06/10/22 1656 107/72     Pulse Rate 06/10/22 1656 79     Resp 06/10/22 1656 16     Temp 06/10/22 1656 98.6 F (37 C)     Temp Source 06/10/22 1656 Oral     SpO2 06/10/22 1656 100 %     Weight --      Height --      Head Circumference --      Peak Flow --      Pain Score 06/10/22 1654 5     Pain Loc --      Pain Edu? --      Excl. in GC? --    No data found.  Updated Vital Signs BP 107/72 (BP Location: Right Arm)   Pulse 79   Temp 98.6 F (37 C) (Oral)   Resp 16   LMP 07/26/2017  (LMP Unknown)   SpO2 100%       Physical Exam Constitutional:      General: She is not in acute distress.    Appearance: She is well-developed.  HENT:     Head: Normocephalic and atraumatic.  Eyes:     Conjunctiva/sclera: Conjunctivae normal.     Pupils: Pupils are equal, round, and reactive to light.  Cardiovascular:     Rate and Rhythm: Normal rate.  Pulmonary:     Effort: Pulmonary effort is normal. No respiratory distress.  Abdominal:     General: There is no distension.     Palpations: Abdomen is soft.     Tenderness: There is no abdominal tenderness. There is no right CVA tenderness or left CVA tenderness.  Musculoskeletal:        General: Normal range of motion.     Cervical back: Normal range of motion.  Skin:    General: Skin is warm and dry.  Neurological:     Mental Status: She is alert.  Psychiatric:        Mood and Affect: Mood normal.        Behavior: Behavior normal.      UC Treatments / Results  Labs (all labs ordered are listed, but only abnormal results are displayed) Labs Reviewed  POCT URINALYSIS DIP (MANUAL ENTRY) - Abnormal; Notable for the following components:      Result Value   Color, UA red (*)    Clarity, UA cloudy (*)    Blood, UA large (*)    Protein Ur, POC >=300 (*)    Leukocytes, UA Moderate (2+) (*)    All other components within normal limits  URINE CULTURE    EKG   Radiology No results found.  Procedures Procedures (including critical care time)  Medications Ordered in UC Medications - No data to display  Initial Impression / Assessment and Plan / UC Course  I have reviewed the triage vital signs and the nursing notes.  Pertinent labs & imaging results that were available during my care of the patient were reviewed by me and considered in my medical decision making (see chart for details).     With chills would have concern for pyelonephritis but she does not have  flank pain or CVA tenderness. Final Clinical  Impressions(s) / UC Diagnoses   Final diagnoses:  Cystitis  Hematuria, unspecified type     Discharge Instructions      Cipro 2 times a day for 7 days Make sure that you are drinking lots of water  Your urine will be sent for culture.  You will be called if any change in antibiotic is indicated   ED Prescriptions     Medication Sig Dispense Auth. Provider   ciprofloxacin (CIPRO) 500 MG tablet Take 1 tablet (500 mg total) by mouth 2 (two) times daily. 14 tablet Eustace Moore, MD      PDMP not reviewed this encounter.   Eustace Moore, MD 06/10/22 [phone]

## 2022-06-10 NOTE — ED Triage Notes (Signed)
Patient presents to Urgent Care with complaints of dysuria starting this morning. Patient reports having discomfort with urination then started seeing blood in urine. She did contact her PCP and could see her tomorrow but she wanted to be seen today. Having low back pain, pelvic pain, chills. Marland Kitchen

## 2022-06-10 NOTE — Discharge Instructions (Addendum)
Cipro 2 times a day for 7 days Make sure that you are drinking lots of water  Your urine will be sent for culture.  You will be called if any change in antibiotic is indicated

## 2022-06-11 ENCOUNTER — Telehealth: Payer: Self-pay

## 2022-06-11 ENCOUNTER — Ambulatory Visit: Payer: No Typology Code available for payment source

## 2022-06-11 NOTE — Telephone Encounter (Signed)
TC to f/u with pt after yesterday's visit to [facility]. Pt states she feels about the same. Vomited up antibiotic this AM, but states she probably didn't have enough food on her stomach. Advised to take next one w/ full stomach and if she is unable to tolerate it, call her PCP or [facility]. She verbalizes understanding.

## 2022-06-13 LAB — URINE CULTURE: Culture: 80000 — AB

## 2022-06-17 ENCOUNTER — Telehealth: Payer: Self-pay | Admitting: Family Medicine

## 2022-06-17 NOTE — Telephone Encounter (Signed)
Please call pt and advise her that she will need a f/u for medication refills. Ok for virtual.

## 2022-06-17 NOTE — Telephone Encounter (Signed)
Pt called back and lvm asking for return call to schedule an appointment.

## 2022-06-17 NOTE — Telephone Encounter (Signed)
LVM for patient to call office to schedule appointment for medication refills!

## 2022-06-26 ENCOUNTER — Ambulatory Visit (INDEPENDENT_AMBULATORY_CARE_PROVIDER_SITE_OTHER): Payer: No Typology Code available for payment source | Admitting: Family Medicine

## 2022-06-26 ENCOUNTER — Encounter: Payer: Self-pay | Admitting: Family Medicine

## 2022-06-26 VITALS — BP 114/65 | HR 68 | Ht 63.0 in | Wt 139.0 lb

## 2022-06-26 DIAGNOSIS — F331 Major depressive disorder, recurrent, moderate: Secondary | ICD-10-CM | POA: Diagnosis not present

## 2022-06-26 DIAGNOSIS — R300 Dysuria: Secondary | ICD-10-CM

## 2022-06-26 DIAGNOSIS — H9311 Tinnitus, right ear: Secondary | ICD-10-CM | POA: Diagnosis not present

## 2022-06-26 DIAGNOSIS — R0981 Nasal congestion: Secondary | ICD-10-CM | POA: Diagnosis not present

## 2022-06-26 LAB — POCT URINALYSIS DIP (CLINITEK)
Bilirubin, UA: NEGATIVE
Glucose, UA: NEGATIVE mg/dL
Ketones, POC UA: NEGATIVE mg/dL
Leukocytes, UA: NEGATIVE
Nitrite, UA: NEGATIVE
POC PROTEIN,UA: NEGATIVE
Spec Grav, UA: 1.015 (ref 1.010–1.025)
Urobilinogen, UA: 0.2 E.U./dL
pH, UA: 7 (ref 5.0–8.0)

## 2022-06-26 MED ORDER — MONTELUKAST SODIUM 10 MG PO TABS: 10.0000 mg | ORAL_TABLET | Freq: Every day | ORAL | 0 refills | Status: DC

## 2022-06-26 NOTE — Progress Notes (Signed)
Acute Office Visit  Subjective:     Patient ID: Brenda Sutton, female    DOB: 12/18/1970, 51 y.o.   MRN: [id number]  Chief Complaint  Patient presents with   Ear Pain    Right ear pain onset for 2 weeks    HPI Patient is in today for acute visit. Pt is brand new to me.  Pt reports a hx of recurrent ear infections since she was a child. She says she's always experience tinnitus. She went up to [place] this past weekend and now is experiencing more ear symptoms, especially in the right ear. She has occasional ear popping, throbbing in her right ear but this past week has worsened. She has hx of seasonal allergies worse to grass. Not taking anything. Does have occasional nasal congestion now.   She reports she still has dysuria after completion of Cipro x 7 days where culture was sensitive to Cipro.  She would like to be re-screened for UTI today.  Past Medical History:  Diagnosis Date   Acute cystitis    Anemia    h/o   Depression    Environmental allergies    grass    Right ovarian cyst    Suicide attempt (HCC)    at age 89   UTI (urinary tract infection)      Review of Systems  Constitutional:  Negative for chills and fever.  HENT:  Positive for congestion, ear pain, hearing loss and tinnitus.   Genitourinary:  Positive for dysuria.  All other systems reviewed and are negative.       Objective:    BP 114/65   Pulse 68   Ht 5\' 3"  (1.6 m)   Wt 139 lb (63 kg)   LMP 07/26/2017 (LMP Unknown)   SpO2 99%   BMI 24.62 kg/m    Physical Exam Vitals and nursing note reviewed.  Constitutional:      Appearance: Normal appearance. She is normal weight.  HENT:     Head: Normocephalic and atraumatic.     Right Ear: Tympanic membrane and external ear normal.     Left Ear: Tympanic membrane and external ear normal.     Ears:     Comments: Effusion bilaterally R>L    Nose: Nose normal.     Mouth/Throat:     Mouth: Mucous membranes are moist.     Pharynx: Oropharynx  is clear.  Eyes:     Extraocular Movements: Extraocular movements intact.     Conjunctiva/sclera: Conjunctivae normal.     Pupils: Pupils are equal, round, and reactive to light.  Cardiovascular:     Rate and Rhythm: Normal rate and regular rhythm.     Pulses: Normal pulses.     Heart sounds: Normal heart sounds.  Pulmonary:     Effort: Pulmonary effort is normal.     Breath sounds: Normal breath sounds.  Skin:    General: Skin is warm.     Capillary Refill: Capillary refill takes less than 2 seconds.  Neurological:     General: No focal deficit present.     Mental Status: She is alert and oriented to person, place, and time. Mental status is at baseline.  Psychiatric:        Mood and Affect: Mood normal.        Behavior: Behavior normal.        Thought Content: Thought content normal.        Judgment: Judgment normal.    No results found  for any visits on 06/26/22.      Assessment & Plan:   Problem List Items Addressed This Visit   None Visit Diagnoses     Dysuria    -  Primary   Relevant Orders   POCT URINALYSIS DIP (CLINITEK)   Urine Culture   Sinus congestion       Relevant Medications   montelukast (SINGULAIR) 10 MG tablet   Tinnitus of right ear           Meds ordered this encounter  Medications   montelukast (SINGULAIR) 10 MG tablet    Sig: Take 1 tablet (10 mg total) by mouth at bedtime.    Dispense:  14 tablet    Refill:  0   Right ear symptoms likely a result from sinus congestion along with [place] causing barotrauma of the right ear. She still has effusion in her ears and suffers from nasal congestion chronically. Will add Singulair for the next 2 weeks. She also asked about nettipot and will try this as well to help with her sinus congestion.  Due to recurrent UTI symptoms, will repeat urine dip with culture today. Urine dip + for hematuria. Will send for culture. Pt is in agreement to have results sent to mychart.   No  follow-ups on file.  Suzan Slick, MD

## 2022-06-28 LAB — URINE CULTURE

## 2022-07-08 ENCOUNTER — Ambulatory Visit: Payer: No Typology Code available for payment source | Admitting: Family Medicine

## 2022-09-04 ENCOUNTER — Ambulatory Visit: Payer: No Typology Code available for payment source | Admitting: Family Medicine

## 2022-09-08 ENCOUNTER — Ambulatory Visit (INDEPENDENT_AMBULATORY_CARE_PROVIDER_SITE_OTHER): Payer: No Typology Code available for payment source | Admitting: Family Medicine

## 2022-09-08 ENCOUNTER — Encounter: Payer: Self-pay | Admitting: Family Medicine

## 2022-09-08 VITALS — BP 103/53 | HR 72 | Ht 63.0 in | Wt 139.0 lb

## 2022-09-08 DIAGNOSIS — F331 Major depressive disorder, recurrent, moderate: Secondary | ICD-10-CM | POA: Diagnosis not present

## 2022-09-08 DIAGNOSIS — F909 Attention-deficit hyperactivity disorder, unspecified type: Secondary | ICD-10-CM | POA: Insufficient documentation

## 2022-09-08 DIAGNOSIS — F411 Generalized anxiety disorder: Secondary | ICD-10-CM

## 2022-09-08 DIAGNOSIS — R41840 Attention and concentration deficit: Secondary | ICD-10-CM | POA: Diagnosis not present

## 2022-09-08 DIAGNOSIS — Z23 Encounter for immunization: Secondary | ICD-10-CM

## 2022-09-08 MED ORDER — ALPRAZOLAM 0.25 MG PO TABS: 0.2500 mg | ORAL_TABLET | Freq: Every day | ORAL | 0 refills | Status: DC | PRN

## 2022-09-08 MED ORDER — FLUOXETINE HCL 20 MG PO TABS: ORAL_TABLET | ORAL | 1 refills | Status: DC

## 2022-09-08 MED ORDER — ESTRADIOL 0.1 MG/GM VA CREA: TOPICAL_CREAM | VAGINAL | 6 refills | Status: AC

## 2022-09-08 NOTE — Assessment & Plan Note (Signed)
Currently on fluoxetine and fairly well-controlled currently.

## 2022-09-08 NOTE — Assessment & Plan Note (Signed)
We had a complete the adult ADHD self-report scale.  In part a she scored high for 4 questions of 6.  In part B she scored high for 8 questions out of 12.  And she remembers having the symptoms since she was in school.  See scanned document.  We discussed options including referral for formal testing for adult ADHD.  Again she does have a family history.  She does deal with depression and anxiety and has over the years but it is currently fairly well-controlled.

## 2022-09-08 NOTE — Assessment & Plan Note (Signed)
Discontinued sertraline because of sexual dysfunction.  Restart fluoxetine. RF xanax.

## 2022-09-08 NOTE — Progress Notes (Signed)
Established Patient Office Visit  Subjective   Patient ID: Brenda Sutton, female    DOB: 23-Aug-1970  Age: 52 y.o. MRN: [id number]  Chief Complaint  Patient presents with   Medication Refill   ADHD    HPI  She would like to discuss inattention.  She would like to be tested for ADD/ADHD.  Her son was diagnosed when he was 69 and after talking with her therapist who she is been working with for greater than a year she had encouraged her to consider getting evaluated.  She is really struggling at work to stay on task and getting things done.  She has a couple of assignments now that were doing most a month ago and just has a hard time really getting focused to complete them.  She says she remembers doing well in school up until she had some of the more challenging and hard classes and then had difficulty focusing.  He does drink caffeine daily.  And for the most part rests okay at night.  This sometimes her mind races a little bit.  She also wanted to let me know that she discontinued the sertraline it was making it difficult for her to reach orgasm so she would like to just go back to the Prozac which she has been on and off of since she was 19.     ROS    Objective:     BP (!) 103/53 (BP Location: Left Arm, Patient Position: Sitting, Cuff Size: Normal)   Pulse 72   Ht '5\' 3"'$  (1.6 m)   Wt 139 lb 0.6 oz (63.1 kg)   LMP 07/26/2017 (LMP Unknown)   SpO2 97%   BMI 24.63 kg/m    Physical Exam Vitals and nursing note reviewed.  Constitutional:      Appearance: She is well-developed.  HENT:     Head: Normocephalic and atraumatic.  Cardiovascular:     Rate and Rhythm: Normal rate and regular rhythm.     Heart sounds: Normal heart sounds.  Pulmonary:     Effort: Pulmonary effort is normal.     Breath sounds: Normal breath sounds.  Skin:    General: Skin is warm and dry.  Neurological:     Mental Status: She is alert and oriented to person, place, and time.  Psychiatric:         Behavior: Behavior normal.      No results found for any visits on 09/08/22.    The ASCVD Risk score (Arnett DK, et al., 2019) failed to calculate for the following reasons:   Cannot find a previous HDL lab   Cannot find a previous total cholesterol lab    Assessment & Plan:   Problem List Items Addressed This Visit       Other   Moderate episode of recurrent major depressive disorder ([facility])    Currently on fluoxetine and fairly well-controlled currently.      Relevant Medications   ALPRAZolam (XANAX) 0.25 MG tablet   FLUoxetine (PROZAC) 20 MG tablet   Inattention - Primary    We had a complete the adult ADHD self-report scale.  In part a she scored high for 4 questions of 6.  In part B she scored high for 8 questions out of 12.  And she remembers having the symptoms since she was in school.  See scanned document.  We discussed options including referral for formal testing for adult ADHD.  Again she does have a family history.  She does deal with depression and anxiety and has over the years but it is currently fairly well-controlled.      Relevant Orders   Ambulatory referral to [facility]   GAD (generalized anxiety disorder)    Discontinued sertraline because of sexual dysfunction.  Restart fluoxetine. RF xanax.       Relevant Medications   ALPRAZolam (XANAX) 0.25 MG tablet   FLUoxetine (PROZAC) 20 MG tablet   Other Visit Diagnoses     Encounter for immunization       Relevant Orders   Pfizer Fall 2023 Covid-19 Vaccine 84yr and older (Completed)       No follow-ups on file.    CBeatrice Lecher MD

## 2022-12-11 NOTE — Progress Notes (Signed)
Date: 12/19/2022 Appointment Start Time: 10:03am (patient arrived late to the appointment) Duration: 100 minutes Provider: Helmut Muster, PsyD Type of Session: Initial Appointment for Evaluation  Location of Patient: Home Location of Provider: Provider's Home (private office) Type of Contact: [employer] Teams video visit with audio  Session Content:  Prior to proceeding with today's appointment, two pieces of identifying information were obtained from Brenda Sutton to verify identity. In addition, Brenda Sutton's physical location at the time of this appointment was obtained. In the event of technical difficulties, Brenda Sutton shared a phone number she could be reached at. Brenda Sutton and this provider participated in today's telepsychological service. Brenda Sutton denied anyone else being present in the room or on the virtual appointment.  The provider's role was explained to Brenda Sutton. The provider reviewed and discussed issues of confidentiality, privacy, and limits therein (e.g., reporting obligations). In addition to verbal informed consent, written informed consent for psychological services was obtained from Brenda Sutton prior to the initial appointment. Written consent included information concerning the practice, financial arrangements, and confidentiality and patients' rights. Since the clinic is not a 24/7 crisis center, mental health emergency resources were shared, and the provider explained e-mail, voicemail, and/or other messaging systems should be utilized only for non-emergency reasons. This provider also explained that information obtained during appointments will be placed in their electronic medical record in a confidential manner. Brenda Sutton verbally acknowledged understanding of the aforementioned and agreed to use mental health emergency resources discussed if needed. Moreover, Brenda Sutton agreed information may be shared with other [facility] or their referring provider(s) as needed for coordination of care. By signing the new patient  documents, Brenda Sutton provided written consent for coordination of care. Brenda Sutton verbally acknowledged understanding she is ultimately responsible for understanding her insurance benefits as it relates to reimbursement of telepsychological and in-person services. This provider also reviewed confidentiality, as it relates to telepsychological services, as well as the rationale for telepsychological services. This provider further explained that video should not be captured, photos should not be taken, nor should testing stimuli be copied or recorded as it would be a copyright violation. Brenda Sutton expressed understanding of the aforementioned, and verbally consented to proceed. Brenda Sutton is aware of the limitations of teleheath visits and verbally consented to proceed.  Brenda Sutton completed the neurobehavioral examination, which included obtaining a, family, social, and psychiatric history; integration of prior history and other sources of clinical data to assist with clinical decision making; behavioral observations; assessment of thinking, reasoning, executive function, and judgment; and establishment of a provisional diagnosis. The evaluation was completed in 100 minutes. Codes 16109 and [id number] were billed.   Mental Status Examination:  Appearance:  neat Behavior: appropriate to circumstances Mood: neutral Affect: mood congruent Speech: pressured Eye Contact: appropriate Psychomotor Activity: restless Thought Process: denies suicidal, homicidal, and self-harm ideation, plan and intent Content/Perceptual Disturbances: none Orientation: AAOx4 Cognition/Sensorium: intact Insight: good Judgment: good  Provisional DSM-5 diagnosis(es):  F89 Unspecified Disorder of Psychological Development   Plan: Testing is expected to answer the question, does the individual meet criteria for ADHD when age, other mental health concerns (e.g., anxiety and depression), and cognitive functioning are taken into consideration? Further  testing is warranted because a diagnosis cannot be given solely based on current interview data (further data is required). Testing results are expected to answer the remaining diagnostic questions in order to provide an accurate diagnosis and assist in treatment planning with an expectation of improved clinical outcome. Brenda Sutton is currently scheduled for an appointment on 12/31/2022 at 4pm via [employer]  video visit with audio.                 Brenda Gouge, PsyD

## 2022-12-19 ENCOUNTER — Ambulatory Visit: Payer: No Typology Code available for payment source | Admitting: Psychology

## 2022-12-19 DIAGNOSIS — F89 Unspecified disorder of psychological development: Secondary | ICD-10-CM

## 2022-12-31 ENCOUNTER — Ambulatory Visit: Payer: No Typology Code available for payment source | Admitting: Psychology

## 2022-12-31 DIAGNOSIS — F89 Unspecified disorder of psychological development: Secondary | ICD-10-CM

## 2022-12-31 NOTE — Progress Notes (Signed)
Date: 12/31/2022   Appointment Start Time: 4pm Duration: 60 minutes Provider: Helmut Muster, PsyD Type of Session: Testing Appointment for Evaluation  Location of Patient: Home Location of Provider: Provider's Home (private office) Type of Contact: [employer] Teams video visit with audio  Session Content: Today's appointment was a telepsychological visit. Brenda Sutton is aware it is her responsibility to secure confidentiality on her end of the session. Prior to proceeding with today's appointment, Brenda Sutton's physical location at the time of this appointment was obtained as well a phone number she could be reached at in the event of technical difficulties. Brenda Sutton denied anyone else being present in the room or on the virtual appointment. This provider reviewed that video should not be captured, photos should not be taken, nor should testing stimuli be copied or recorded as it would be a copyright violation. Brenda Sutton expressed understanding of the aforementioned, and verbally consented to proceed. The WAIS-IV was administered, scored, and interpreted by this evaluator. Brenda Sutton is aware of the limitations of teleheath visits and verbally consented to proceed.  Billing codes will be input on the feedback appointment. There are no billing codes for the testing appointment.   Provisional DSM-5 diagnosis(es):  F89 Unspecified Disorder of Psychological Development   Plan: Brenda Sutton was scheduled for a feedback appointment on 01/15/2023 at 5pm via [employer] video visit with audio.                Margarite Gouge, PsyD

## 2023-01-06 ENCOUNTER — Ambulatory Visit
Admission: EM | Admit: 2023-01-06 | Discharge: 2023-01-06 | Disposition: A | Discharge status: Home / Self Care | Payer: No Typology Code available for payment source | Attending: Family Medicine | Admitting: Family Medicine

## 2023-01-06 ENCOUNTER — Encounter: Payer: Self-pay | Admitting: Emergency Medicine

## 2023-01-06 DIAGNOSIS — B9689 Other specified bacterial agents as the cause of diseases classified elsewhere: Secondary | ICD-10-CM | POA: Diagnosis not present

## 2023-01-06 DIAGNOSIS — H6693 Otitis media, unspecified, bilateral: Secondary | ICD-10-CM | POA: Diagnosis not present

## 2023-01-06 DIAGNOSIS — J069 Acute upper respiratory infection, unspecified: Secondary | ICD-10-CM | POA: Diagnosis not present

## 2023-01-06 MED ORDER — AMOXICILLIN-POT CLAVULANATE 875-125 MG PO TABS: 1.0000 | ORAL_TABLET | Freq: Two times a day (BID) | ORAL | 0 refills | Status: DC

## 2023-01-06 NOTE — Discharge Instructions (Signed)
May continue with over the counter cough and cold medication Tylenol or ibuprofen for pain Take the augmentin 2 x a day Consider a probiotic while on augmentin See your PCP if not better by next week

## 2023-01-06 NOTE — ED Triage Notes (Signed)
Pt states she has been having sore throat and bilateral ear pressure for the last week. She has tried some OTC allergy meds but states symptoms are worsening.

## 2023-01-06 NOTE — ED Provider Notes (Signed)
[facility]    CSN: [id number] Arrival date & time: 01/06/23  1152      History   Chief Complaint Chief Complaint  Patient presents with   Sore Throat    HPI Brenda Sutton is a 52 y.o. female.   HPI   Patient has an URI for over a week.  Sore throat and ear pressure.  .  Is having increasing ear pressure and pain with time.  No improving with OTC medication. Has sinus pressure and PND.  Not per usual allergy symptoms   Past Medical History:  Diagnosis Date   Acute cystitis    Anemia    h/o   Depression    Environmental allergies    grass    Right ovarian cyst    Suicide attempt (HCC)    at age 15   UTI (urinary tract infection)     Patient Active Problem List   Diagnosis Date Noted   Inattention 09/08/2022   Moderate episode of recurrent major depressive disorder (HCC) 06/26/2022   Seasonal allergies 11/13/2021   Hot flash, menopausal 11/13/2021   Right ovarian cyst 08/18/2017   Routine general medical examination at a health care facility 01/11/2016   Metatarsophalangeal (joint) sprain 10/27/2013   Hypothyroidism 05/16/2013   BENIGN POSITIONAL VERTIGO 10/27/2008   Depression 07/30/2007   GAD (generalized anxiety disorder) 11/03/2006    Past Surgical History:  Procedure Laterality Date    tear duct   age 75   minor injury from toy had surgery to repair ; no issues post op    ROBOTIC ASSISTED BILATERAL SALPINGO OOPHERECTOMY Left 08/18/2017   Procedure: XI ROBOTIC ASSISTED LEFT SALPINGO OOPHORECTOMY;  Surgeon: Adolphus Birchwood, MD;  Location: [facility];  Service: Gynecology;  Laterality: Left;   TONSILLECTOMY  age 89    childhood     OB History   No obstetric history on file.      Home Medications    Prior to Admission medications   Medication Sig Start Date End Date Taking? Authorizing Provider  amoxicillin-clavulanate (AUGMENTIN) 875-125 MG tablet Take 1 tablet by mouth every 12 (twelve) hours. 01/06/23  Yes Eustace Moore, MD   ALPRAZolam Prudy Feeler) 0.25 MG tablet Take 1-2 tablets (0.25-0.5 mg total) by mouth daily as needed for anxiety. 09/08/22   Agapito Games, MD  estradiol (ESTRACE VAGINAL) 0.1 MG/GM vaginal cream Place 1 applicatorful to the vagina  twice weekly. 09/08/22   Agapito Games, MD  fexofenadine (ALLEGRA) 180 MG tablet Take 180 mg by mouth daily. 12/11/21   [provider]  FLUoxetine (PROZAC) 20 MG tablet Take 0.5 tablets (10 mg total) by mouth daily for 10 days, THEN 1 tablet (20 mg total) daily for 20 days. 09/08/22 10/08/22  Agapito Games, MD    Family History Family History  Problem Relation Age of Onset   Lymphoma Father        Non hodgkins    Depression Other     Social History Social History   Tobacco Use   Smoking status: Never   Smokeless tobacco: Never  Vaping Use   Vaping Use: Never used  Substance Use Topics   Alcohol use: Yes    Alcohol/week: 2.0 standard drinks of alcohol    Types: 2 Standard drinks or equivalent per week   Drug use: Yes    Types: Marijuana    Comment: denies      Allergies   Erythromycin and Prozac [fluoxetine]   Review of Systems  Review of Systems  See HPI Physical Exam Triage Vital Signs ED Triage Vitals [01/06/23 1200]  Enc Vitals Group     BP 103/70     Pulse Rate 65     Resp 16     Temp 98 F (36.7 C)     Temp Source Oral     SpO2 98 %     Weight      Height      Head Circumference      Peak Flow      Pain Score 3     Pain Loc      Pain Edu?      Excl. in GC?    No data found.  Updated Vital Signs BP 103/70 (BP Location: Right Arm)   Pulse 65   Temp 98 F (36.7 C) (Oral)   Resp 16   LMP 07/26/2017 (LMP Unknown)   SpO2 98%      Physical Exam Constitutional:      General: She is not in acute distress.    Appearance: She is well-developed and normal weight. She is ill-appearing.  HENT:     Head: Normocephalic and atraumatic.     Right Ear: A middle ear effusion is present.     Left Ear:  No  middle ear effusion.     Ears:     Comments: Both TM are dull and retracted with mild injection peripherally    Nose: Congestion and rhinorrhea present.     Mouth/Throat:     Pharynx: Posterior oropharyngeal erythema present.     Tonsils: No tonsillar exudate. 0 on the right. 0 on the left.  Eyes:     Conjunctiva/sclera: Conjunctivae normal.     Pupils: Pupils are equal, round, and reactive to light.  Cardiovascular:     Rate and Rhythm: Normal rate.  Pulmonary:     Effort: Pulmonary effort is normal. No respiratory distress.  Abdominal:     General: There is no distension.     Palpations: Abdomen is soft.  Musculoskeletal:        General: Normal range of motion.     Cervical back: Normal range of motion.  Lymphadenopathy:     Cervical: Cervical adenopathy present.  Skin:    General: Skin is warm and dry.  Neurological:     Mental Status: She is alert.      UC Treatments / Results  Labs (all labs ordered are listed, but only abnormal results are displayed) Labs Reviewed - No data to display  EKG   Radiology No results found.  Procedures Procedures (including critical care time)  Medications Ordered in UC Medications - No data to display  Initial Impression / Assessment and Plan / UC Course  I have reviewed the triage vital signs and the nursing notes.  Pertinent labs & imaging results that were available during my care of the patient were reviewed by me and considered in my medical decision making (see chart for details).     Final Clinical Impressions(s) / UC Diagnoses   Final diagnoses:  Upper respiratory tract infection, unspecified type  Bacterial ear infection, bilateral     Discharge Instructions      May continue with over the counter cough and cold medication Tylenol or ibuprofen for pain Take the augmentin 2 x a day Consider a probiotic while on augmentin See your PCP if not better by next week   ED Prescriptions     Medication Sig  Dispense Auth. Provider  amoxicillin-clavulanate (AUGMENTIN) 875-125 MG tablet Take 1 tablet by mouth every 12 (twelve) hours. 14 tablet Eustace Moore, MD      PDMP not reviewed this encounter.   Eustace Moore, MD 01/06/23 1328

## 2023-01-15 ENCOUNTER — Ambulatory Visit: Payer: No Typology Code available for payment source | Admitting: Psychology

## 2023-01-15 DIAGNOSIS — F411 Generalized anxiety disorder: Secondary | ICD-10-CM | POA: Diagnosis not present

## 2023-01-15 DIAGNOSIS — F331 Major depressive disorder, recurrent, moderate: Secondary | ICD-10-CM | POA: Diagnosis not present

## 2023-01-15 DIAGNOSIS — F902 Attention-deficit hyperactivity disorder, combined type: Secondary | ICD-10-CM | POA: Diagnosis not present

## 2023-01-15 NOTE — Progress Notes (Addendum)
Testing and Report Writing Information: The following measures  were administered, scored, and interpreted by this provider:  Generalized Anxiety Disorder-7 (GAD-7; 5 minutes), Patient Health Questionnaire-9 (PHQ-9; 5 minutes), Wechsler Adult Intelligence Scale-Fourth Edition (WAIS-IV; 70 minutes), CNS Vital Signs (45 minutes), Adult Attention Deficit/Hyperactivity Disorder Self-Report Scale Checklist (ASRSv1.1; 15 minutes), Behavior Rating Inventory for Executive Function - A - Self Report (BRIEF A; 10 minutes) and Behavior Rating Inventory for Executive Function - A - Informan (BRIEF-A; 10 minutes) , Personality Assessment Inventory (PAI; 50 minutes). A total of 210 minutes was spent on the administration and scoring of the aforementioned measures. Codes 57846 and [phone] (6 units) were billed.  Please see the assessment for additional details. This provider completed the written report which includes integration of patient data, interpretation of standardized test results, interpretation of clinical data, review of information provided by [facility] and any collateral information/documentation, and clinical decision making (280 minutes in total).  Feedback Appointment: Date: 01/15/2023 Appointment Start Time: 5pm Duration: 57 minutes Provider: Helmut Muster, PsyD Type of Session: Feedback Appointment for Evaluation  Location of Patient: Home Location of Provider: Provider's Home (private office) Type of Contact: Caregility video visit with audio  Session Content: Today's appointment was a telepsychological visit due to COVID-19. [name] is aware it is her responsibility to secure confidentiality on her end of the session. She provided verbal consent to proceed with today's appointment. Prior to proceeding with today's appointment, [name]'s physical location at the time of this appointment was obtained as well a phone number she could be reached at in the event of technical difficulties. [name] denied anyone  else being present in the room or on the virtual appointment. [name] is aware of the limitations of teleheath visits and verbally consented to proceed.  This provider and [name] completed the interactive feedback session which includes reviewing the aforementioned measures, treatment recommendations, and diagnostic conclusions.   The interactive feedback session was completed today and a total of 57 minutes was spent on feedback. Code 28413 was billed for feedback session.   DSM-5 Diagnosis(es):  F90.2 Attention-Deficit/Hyperactivity Disorder, Combined Presentation, Moderate F33.1 Major Depressive Disorder, Recurrent, Moderate  F41.1 Generalized Anxiety Disorder   Time Requirements: Assessment scoring and interpreting: 210 minutes (billing code 24401 and [phone] [6 units]) Feedback: 57 minutes (billing code 36644) Report writing: 295 total minutes. 12/11/2022: 2:45-3pm (inputting chart review information into the evaluation). 12/21/2022: 3:50-5:10pm. 12/31/2022: 5:15-6:20pm. 01/09/2023: 6:50-7:30pm. 01/11/2023: 5:20-6:20pm and 8:10-8:25pm. 01/12/2023: 8:25-8:30pm. 01/15/2023: 6:15- 6:30pm (billing code [id number] [5 units])  Plan: [name] provided verbal consent for her evaluation to be sent via e-mail. No further follow-up planned by this provider.             CONFIDENTIAL PSYCHOLOGICAL EVALUATION ______________________________________________________________________________  Name: Brenda Sutton   Date of Birth: 25-May-1971    Age: 52 Dates of Evaluation: 12/19/2022, 12/26/2022, 12/31/2022, and 01/07/2023  SOURCE AND REASON FOR REFERRAL: Brenda Sutton was referred by Dr. Nani Gasser for an evaluation to ascertain if she meets criteria for Attention Deficit/Hyperactivity Disorder (ADHD).   EVALUATIVE PROCEDURES: Clinical Interview with Brenda Sutton (12/19/2022) Wechsler Adult Intelligence Scale-Fourth Edition (WAIS-IV; 12/31/2022) CNS Vital Signs (12/26/2022) Adult Attention  Deficit/Hyperactivity Disorder Self-Report Scale Checklist (12/26/2022) Behavior Rating Inventory for Executive Function - A - Self Report Behavior Rating Inventory for Executive Function - A - Self Report (BRIEF-A; 01/09/2023) and Informant (01/09/2023) Personality Assessment Inventory (PAI; 01/09/2023) Patient Health Questionnaire-9 (PHQ-9) Generalized Anxiety Disorder-7 (GAD-7)   BACKGROUND INFORMATION AND PRESENTING PROBLEM: Brenda Sutton is a  52 year old female who resides in [place].  Brenda Sutton reported she "suspect[s] [she has] ADHD" as "things in [her] life are difficult for [her] to navigate" and she has "difficulty focusing." She described the following ADHD-related concerns as occurring often: task initiation (e.g., deadlines and a sense of urgency are primary motivators to start tasks, but that tends to delay starting tasks she "do[es] not like" to an extent she misses deadlines which has negatively impacted her employment and academic performance) and maintenance (e.g., becoming distracted by, and starting other tasks prior to completing the initial task) issues; being easily distracted by various stimuli (e.g., sounds, visuals, and irrelevant thoughts) and describing her mind as commonly being elsewhere even when there are no obvious distractions, adding her mind is commonly "restless" and her "inner monologue is loud and like three or four people" talking constantly; expressing a belief that ensuring she does not make careless mistakes or miss small details "might take more effort [for her than others]" which can contribute to delaying starting tasks with sustained attention requirements; interrupting others, which she attributed to a belief she "know[s] what they are going to say" and/or concerns she will forget what she wants to say; disorganization (e.g., difficulty prioritizing information and knowing how to start and complete multi-step tasks), which she shared can contribute to  task initiation and maintenance issues as well as clutter being in her environment; planning problems, noting she "find[s] it difficult," is prone to "waiting until last minute," having "reduced motivation" to plan, and indecisiveness; forgetfulness (e.g., not remembering to eat, where she placed items, needed items for tasks, and plans); fidgeting (e.g., playing with various items and "hum[ming] to [herself]") that is, at times, unconscious; excessive talking that she described as "explaining with lots of details" and limited awareness of "how long she [has been] talking;" impatience (e.g. desire to "speed up" a conversation and becoming agitated when someone is perceived as completing a task in a "slow" manner); being prone to driving faster than others, which she attributed to "running late" or not being mindful of her speed, which resulted in two speeding tickets with the last ticket occurring in 2012; impulsivity (e.g., making purchases of various low priced items that she later regrets having purchased and can cause difficulty saving, jumping in tasks without fully understanding the directions which can cause mistakes to be made, and having a "sharp tongue" despite efforts to "be conscious of what [she is] saying"); feeling irritable and/or overwhelmed when experiencing self-critical thoughts that stem from various ADHD-related difficulties, someone is perceived as "going slow" or "not doing what they are supposed to," "something [is] not functioning as expected," or her attention is broken from a task due to a perceived disregard of a "do not disturb" indicator or the interruption being "for a dumb reason." She described the following ADHD-related concerns as occasional: task disengagement (e.g., trouble stopping tasks she "enjoy[s]" which she indicated is primarily due to efforts to delay starting a task she "do[es] not want," which can negatively impact her ability to start and complete higher priority  tasks) and trouble following through on promises or commitments she makes to others which can cause relational strains. Brenda Sutton discussed a history of depressive episodes that included being "borderline suicidal" from the ages of [age], noting she currently utilizes Prozac which makes her depression "manageable", has since become aware of the "warning signs" of a depressive episode, and her mood is "improved" currently; generalized anxiety that she described as regularly having an "underlying  dread" and a "sense of impending doom;" sleep onset and maintenance issues that she stated she utilizes cannabis to assist with as well as regularly tossing and turning in her sleep; and suicidal ideation with instances that have include plan and intent, noting confidence in her ability to keep herself safe and to reach out to trusted others and/or emergency services should she experience a mental health crisis and that the last instance occurred prior to her child's birth. She expressed a belief her ADHD-related concerns have "always" been present and are consistent and independent of mood, but stress can exacerbate them. Ms. Busk stated her coping and compensatory strategies include writing down what she wants to say, hiring a cleaner for her home, checking if she has needed items before leaving her home, "checking in" on how long she has been talking and "what [she is] saying," and use of cruise control while driving.  Brenda Sutton denied awareness of having ever experienced any developmental milestone delays, grade retention, learning disability diagnosis, or having an individualized education plan. She discussed receiving "between As and Cs" throughout primary and secondary schooling, stating she had natural academic abilities but "struggled in classes" when it was "not possible to complete assignments last minute." She further discussed throughout schooling she regularly "put off assignments until last minute;"  "studied the morning before the test;" and would "get in trouble for talking when [she] was not supposed to," "arguing with the teachers," and "skipping dead boring classes." She reported she obtained an associate's degree with a 3.5 GPA, but it required "significant effort" to "complete assignments," she regularly "bullshitt[ed] essays," and ADHD-related difficulties contributed to her "hat[ing] school" which prevented her from pursuing further education despite having a belief that doing so would allow her to obtain desired employments. Brenda Sutton described an employment disciplinary history that includes "hav[ing] a big problem with tardiness" and trouble sustaining her attention.  Brenda Sutton reported her current medical status is unremarkable, and she denied awareness of having ever experiencing a head injury or seizure. She shared she is currently utilizing psychotherapeutic services for "depression," which has been helpful. She further shared she has a history of two instance of psychiatric hospitalization, with the first instance being for "depression" in 1991 and the second instance for "depression" and a "suicide attempt" in 1992. Brenda Sutton reported utilizing approximately two standard size alcohol drinks a week, cannabis "almost daily in the evening," and approximately six standard size cups of coffee daily. She denied ever meeting full criteria for hypomanic or manic episode; eating disorder; psychosis; trauma- and stressor-related disorder symptomatology; homicidal ideation, plan, or intent; or legal involvement. She shared her familial mental health history is significant for depression (father) and ADHD (son).   Chart Review: Per an appointment note dated 09/08/2022, Dr. Nani Gasser reported "[Brenda Sutton] would like to discuss inattention" and "be tested for ADD/ADHD." She further reported "[Brenda Sutton's] son was diagnosed when he was 4 and after talking with her therapist who she  is been working with for greater than a year she had encouraged her to consider getting evaluated," adding "she is really struggling at work to stay on task and getting things done,"  "remembers doing well in school up until she had some of the more challenging and hard classes and then had difficulty focusing," "drink[s] caffeine daily," and "sometimes her mind races a little bit."  BEHAVIORAL OBSERVATIONS: Brenda Sutton presented on time for the evaluation. She was well-groomed. She was oriented to time, place, person, and  purpose of the appointment. During the evaluation Brenda Sutton verbalized and/or demonstrated doubt (e.g., stating "I don't know" or "I think" after having provided a correct answer), auditory working memory difficulties (e.g., stating verbally provided digits back to this evaluator in the requested manner quickly and with pressured speech, noting she could no longer remember the digits shortly after they were provided which led to her concluding with answers she indicated she knew were erroneous, and using hand and finger gestures to provide visual components to verbally provided information), and long-term memory retrieval issues (e.g., sharing that additional information about various questions would "come to [her] later"). Throughout the course of the evaluation, she maintained appropriate eye contact. Her thought processes and content were logical, coherent, and goal-directed. There were no overt signs of a thought disorder or perceptual disturbances, nor did she report such symptomatology. There was no evidence of paraphasias (i.e., errors in speech, gross mispronunciations, and word substitutions), repetition deficits, or disturbances in volume or prosody (i.e., rhythm and intonation). Overall, based on Brenda Sutton's approach to testing, the current results are believed to be a fair-good estimate of her abilities.  PROCEDURAL CONSIDERATIONS:  Psychological testing measures were  conducted through a virtual visit with video and audio capabilities, but otherwise in a standard manner.   The Wechsler Adult Intelligence Scale, Fourth Edition (WAIS-IV) was administered via remote telepractice using digital stimulus materials on [employer] Q-global system. The remote testing environment appeared free of distractions, adequate rapport was established with the examinee via video/audio capabilities, and Ms. Outman appeared appropriately engaged in the task throughout the session. No significant technological problems or distractions were noted during administration. Modifications to the standardization procedure included: none. The WAIS-IV subtests, or similar tasks, have received initial validation in several samples for remote telepractice and digital format administration, and the results are considered a valid description of Ms. Killion's skills and abilities.  CLINICAL FINDINGS:  COGNITIVE FUNCTIONING  Wechsler Adult Intelligence Scale, Fourth Edition (WAIS-IV): Ms. Gagan completed subtests of the WAIS-IV, a full-scale measure of cognitive ability. The WAIS-IV is comprised of four indices that measures cognitive processes that are components of intellectual ability; however, only subtests from the Verbal Comprehension and Working Memory indices were administered. As a result, Full-Scale-IQ (FSIQ) and General Ability Index (GAI) were unable to be determined.   WAIS-IV Scale/Subtest IQ/Scaled Score 95% Confidence Interval Percentile Rank Qualitative Description Verbal Comprehension (VCI) 138 131-142 99 Very Superior Similarities 17    Vocabulary 18    Information 14    Working Memory (WMI) 133 124-138 99 Very Superior Digit Span 15    Arithmetic 17      The Verbal Comprehension Index (VCI) provides a measure of one's ability to receive, comprehend, and express language. It also measures the ability to retrieve previously learned information and to understand relationships  between words and concepts presented orally. Brenda Sutton obtained a VCI scaled score of 138 (99th percentile) placing her in the very superior range compared to same-aged peers. Her performance on the subtests comprising this index was diverse. Out of the three subtests, Brenda Sutton demonstrated the strongest performance on the Vocabulary subtest, which required her to explain the meaning of words presented in isolation. Additionally, performance on this subtest requires abilities to verbalize meaningful concepts, as well as retrieve information from long-term memory. Her lowest performance was on the Information subtest which is primarily a measure of her fund of general knowledge but may also be influenced by cultural experience, quality of education, and ability  to retrieve information from long-term memory.   The Working Memory Index (WMI) provides a measure of one's ability to sustain attention, concentrate, and exert mental control. Ms. Ainley obtained a WMI scaled score of 133 (99th percentile), placing her in the very superior range compared to same-aged peers. Her score on the Arithmetic subtest is higher than her score on Digit Span, which may indicate she experiences strengths in her arithmetic computational abilities. Upon follow-up, Ms. Bohlken shared she would visualize "writ[ing] the verbally provided digits] down inside of [her] brain" and that she "used her fingers" to assist with providing a visual component to the verbally provided information as well as that her strong numerical calculation abilities and "just track[ing] the numbers" and required calculations assisted her on the [facility] subtests. She expressed a belief if she was required to pause before providing an answer on the Digit Span tasks or if she had to track other details of the Arithmetic subtest items it would have negatively impacted her performance on the subtests.  ATTENTION AND PROCESSING  CNS Vital Signs: The CNS Vital  Signs assessment evaluates the neurocognitive status of an individual and covers a range of mental processes. The results of the CNS Vital Signs testing indicated average neurocognitive processing ability, although she approached the above range. Her attentional abilities were average-to-above, with simple attention in the average range and complex attention and sustained attention in the above range. Executive function, working memory, and cognitive flexibility were in the above range. Processing speed was in the above range and 99th percentile, which suggests her thinking speed is faster than many of her peers. Psychomotor speed was average but approached the above range. Motor speed and reaction time were average. Visual memory (images) was above, and verbal memory (words) was average, which indicates visual memory is a relative strength. The results suggest Ms. Leatherwood experiences strengths in visual memory, complex attention, cognitive flexibility, processing speed, executive function, working memory, and sustained attention. She demonstrated relative weaknesses in reaction time and motor speed (i.e., her scores approached the low average and were 19 points lower than her neurocognitive index score). Upon follow-up, Ms. Tith reported she "enjoyed" the CNS Vital Signs as it was "something I could be successful at" and she perceived it as "a little game," expressing a belief this helped her to "hyperfocus" on it at times; however, she stated she "got twitchy toward the end" of some of the tasks.   Domain  Standard Score Percentile Validity Indicator Guideline Neurocognitive Index 109 73 Yes Average Composite Memory 116 86 Yes Above Verbal Memory 99 47 Yes Average Visual Memory 125 95 Yes Above Psychomotor Speed 109 73 Yes Average Reaction Time 90 25 Yes Average Complex Attention 118 88 Yes Above Cognitive Flexibility 111 77 Yes Above Processing Speed  135 99 Yes Above Executive  Function 110 75 Yes Above Working Memory 121 92 Yes Above Sustained Attention 115 84 Yes Above Simple Attention 107 68 Yes Average Motor Speed 90 25 Yes Average  EXECUTIVE FUNCTION  Behavior Rating Inventory of Executive Function, Second Edition (BRIEF-A) Self-Report:  Ms. Hoselton completed the Self-Report Form of the Behavior Rating Inventory of Executive Function-Adult Version (BRIEF-A), which has three domains that evaluate cognitive, behavioral, and emotional regulation, and a Global Executive Composite score provides an overall snapshot of executive functioning. There are no missing item responses in the protocol. The Negativity, Infrequency, and Inconsistency scales are not elevated, suggesting she did not respond to the protocol in an overly negative, haphazard, extreme,  or inconsistent manner. In the context of these validity considerations, ratings of Ms. Dziedzic's everyday executive function suggest some areas of concern. The overall index, the Global Executive Composite (GEC), was elevated (GEC T = 74, %ile = >99). The Behavioral Regulation Index (BRI) was within normal limits (BRI T = 54, %ile = 73) and the Metacognition Index (MI) was elevated (MI T = 86, %ile = >99). Ms. Sharrow indicated difficultly with her ability to inhibit impulsive responses, initiate problem solving or activity, sustain working memory, plan and organize problem-solving approaches, attend to task-oriented output, and organize environment and materials. She did not describe her ability to adjust to changes in routine or task demands, modulate emotions, and monitor social behavior as problematic.    Scale/Index  Raw Score T Score Percentile Inhibit 16 67 99 Shift 9 53 74 Emotional Control 11 42 36 Self-Monitor 10 56 82 Behavioral Regulation Index (BRI) 46 54 73 Initiate 20 82 99 Working Memory 17 74 99 Plan/Organize 23 78 99 Task Monitor 15 82 >99 Organization of Materials 24 89 >99 Metacognition Index  (MI) 99 86 >99 Global Executive Composite (GEC) 145 74 >99  Validity Scale Raw Score Cumulative Percentile Protocol Classification Negativity 1 0 - 98.3 Acceptable Infrequency 0 0 - 97.3 Acceptable Inconsistency 1 0 - 99.2 Acceptable  Behavior Rating Inventory of Executive Function, Second Edition (BRIEF-A) Informant: Ms. Finnell's son, Mr. Rosalin Hawking, completed the Informant Form of the Behavior Rating Inventory of Executive Function-Adult Version (BRIEF-A), which is equivalent to the Self-Report version and has three domains that evaluate cognitive, behavioral, and emotional regulation, and a Global Executive Composite score provides an overall snapshot of executive functioning. There are no missing item responses in the protocol. The Negativity, Infrequency, and Inconsistency scales are not elevated, suggesting he did not respond to the protocol in an overly negative, haphazard, extreme, or inconsistent manner. In the context of these validity considerations, Mr. Ardelia Mems ratings of Ms. Sundeen's everyday executive function suggest some areas of concern. The overall index, the Global Executive Composite (GEC), was within the non-elevated range for age (GEC T = 56, %ile = 81). The Behavioral Regulation (BRI) and Metacognition (MI) Indexes were within normal limits (BRI T = 50, %ile = 64 and MI T = 61, %ile = 87). Mr. Geralyn Flash indicated Ms. Savannah has trouble with her ability to attend to task-oriented output. He did not describe her ability to inhibit impulsive responses, adjust to changes in routine or task demands, modulate emotions, monitor social behavior, initiate problem solving or activity, sustain working memory, plan and organize problem-solving approaches, and organize environment and materials as problematic, although the Working [employer]maker [employer] scales approached an abnormal elevation. Upon follow-up, Ms. Munroe reported her son has been diagnosed with ADHD which may  impact his perception of "what is normal," that he indicated to her he "had a hard time answering the questions" given the limited answers to select from, and that she is "really good at compensating and covering for [her executive function problems]," which may at least partially explain the discrepancy between her and Mr. Tweedy's ratings of her executive function abilities.   Scale/Index  Raw Score T Score Percentile Inhibit 13 57 88 Shift 9 51 69 Emotional Control 14 48 61 Self-Monitor 7 44 41 Behavioral Regulation Index (BRI) 43 50 64 Initiate 13 57 84 Working Memory 15 64 92 Plan/Organize 15 54 80 Task Monitor 12 65 93 Organization of Materials 16 60 88 Metacognition Index (MI) 71 61  87 Global Executive Composite (GEC) 114 56 81  Validity Scale Raw Score Cumulative Percentile Protocol Classification Negativity 0 0 - 98.5 Acceptable Infrequency 0 0 - 93.3 Acceptable Inconsistency 2 0 - 98.8 Acceptable  BEHAVIORAL FUNCTIONING   Patient Health Questionnaire-9 (PHQ-9): Ms. Bach completed the PHQ-9, a self-report measure that assesses symptoms of depression. She scored 12/27, which indicates moderate depression.   Generalized Anxiety Disorder-7 (GAD-7): Brenda Sutton completed the GAD-7, a self-report measure that assesses symptoms of anxiety. She scored 11/21, which indicates moderate anxiety.   Adult ADHD Self-Report Scale Symptom Checklist (ASRS): Ms. Lejeune reported the following symptoms as sometimes: leaving her seat when expected to stay seated, talking too much in social situations, and interrupting others when they are busy. She endorsed the following symptoms as occurring often: difficulty getting things in order when a task requires organization, problems remembering appointments or obligations, avoiding or delaying getting started on tasks requiring a lot of thought, struggling to sustain attention when doing boring or repetitive work, struggling to concentrate on what  people say even when they are speaking directly to her, and being distracted by noise around her. She endorsed the following symptoms as very often: difficulty wrapping up final details of a project following the completion of challenging aspects, fidgeting or squirming, misplacing or has difficulty finding things, and interrupting others or finishing their sentences. Endorsement of at least four items in Part A is highly consistent with ADHD in adults. The frequency scores of Part B provides additional cues. Ms. Schuchard scored a 5/6 on Part A and 7/12 on Part B, which is considered a positive screening for ADHD.   Personality Assessment Inventory (PAI): The PAI is an objective inventory of adult personality. The validity indicators suggested Ms. Balogh's profile is interpretable (ICN T = 49, INF T = 47, NIM T = 47, and PIM T = 41). She is endorsing concerns about her health functioning (SOM T = 62 and SOM-H T = 66); ruminative worry and concern that likely impairs attention (ANX T = 67, ANX-C T = 64, and DEP-C T = 64), difficulty relaxing and fatigue (ANX-A T = 65), and overt physical signs of tension (e.g., sweaty palms, trembling hands, complaints of irregular heartbeat, and shortness of breath; ANX-P T = 66); prominent unhappiness and dysphoria (DEP T = 71 and DEP-A T = 77) that includes beliefs of inadequacy (DEP-C T = 64), vegetative signs of depression (e.g., sleep disturbance, fatigue, and appetite problems; DEP-P T = 62), and recurrent thoughts related to suicide (SUI T = 84); an activity level that is somewhat higher than normal (MAN-A T = 57); experiencing a loosening of associations and difficulties in self-expression and communication (SCZ-T T = 75), which may contribute to her neither desiring nor enjoying close relationships (SCZ-S T = 71, NON T = 61, and WRM T = 35), being pragmatic and skeptical in relationships with others (PAR-H T = 60), and experiencing historical problems with authority and  social convention (ANT-A T = 64); and using drugs on a fairly regularly and possibly having experienced some adverse consequences as a result (DRG T = 60). Ms. Tsukamoto appears to acknowledge the need to make some changes, has a positive attitude toward the possibility of personal change, and accepts the importance of personal responsibility (RXR T = 42).   SUMMARY AND CLINICAL IMPRESSIONS: Ms. Loni Delbridge is a 52 year old female who was referred by Dr. Nani Gasser for an evaluation to determine if she currently meets criteria for a  diagnosis of Attention-Deficit/Hyperactivity Disorder (ADHD).   Ms. Brunke expressed a belief she may meet criteria for ADHD as "things in [her] life are difficult for [her] to navigate" and she has "difficulty focusing." She expressed a belief her ADHD-related concerns have "always" been present and are consistent and independent of mood, but stress can exacerbate them.  During the evaluation, Ms. Klooster was administered assessments to measure her current cognitive abilities. Her verbal comprehension abilities were in the very superior range, and she demonstrated the strongest performance on the Vocabulary subtest, which required her to explain the meaning of words presented in isolation. Additionally, performance on this subtest requires abilities to verbalize meaningful concepts, as well as retrieve information from long-term memory. Her lowest performance was on the Information subtest which is primarily a measure of her fund of general knowledge but may also be influenced by cultural experience, quality of education, and ability to retrieve information from long-term memory. Her ability to sustain attention, concentrate, and exert mental control was also in the very superior range. Her higher score on the Arithmetic subtest suggests she has particularly strong arithmetic computational abilities. Results of the CNS Vital Signs indicated an average neurocognitive  processing ability, although her score approached the above range. She demonstrated relative weaknesses in reaction time and motor speed (i.e., her scores approached the low average and were 19 points lower than her neurocognitive index score).  During the clinical interview and on self-report measures, Ms. Salvaggio endorsed multiple executive functioning concerns and meeting full criteria for ADHD; however, her son, Mr. Rosalin Hawking, largely denied perceiving her as experiencing clinically significant executive functioning issues (only one scale was abnormally elevated, although two approached an abnormal elevation). It is currently unclear what caused this discrepancy, although Ms. Foister reported her son has been diagnosed with ADHD which may impact his perception of "what is normal" as well as a belief she is "really good at compensating and covering for [her executive function problems]," which may at least partially explain the discrepancy between their perceptions of her executive function abilities. While this discrepancy makes interpretation difficult, when considering self-reported symptoms; endorsed and/or demonstrated impairment or weakness on measures of executive functioning and reaction time; a reported familial history of ADHD; and chart review indicating Ms. Terwilliger reported her therapist recommended she pursue an ADHD evaluation, a diagnosis of F90.2 Attention-Deficit/Hyperactivity Disorder, Combined Presentation, Moderate appears warranted. The specifier of "Moderate" was assigned as she indicated her endorsed symptoms impair her academic (e.g., regularly putting off assignments until last minute and "struggl[ing]" in classes in which this was not possible, "get[ting] in trouble for talking when [she] was not supposed to," "arguing with teachers," and "skipping dead boring classes"), occupational (e.g., trouble sustaining her attention on tasks and "having a big problem with tardiness"),  social (e.g., often engaging in excessive talking and interrupting of others), and daily (e.g., commonly experiencing task initiation and completion issues, forgetfulness, and being easily distracted) function.  Ms. Danish also endorsed a history of depressive episodes that are "manageable" with use of Prozac, adding she has also become aware of the "warning signs" of a depressive episode and her mood is "improved" currently; generalized anxiety that she described as regularly having an "underlying dread" and a "sense of impending doom;" sleep onset and maintenance issues with use of cannabis as a sleep aid; regularly tossing and turning in her sleep; and suicidal ideation with instances that have include plan and intent. As such, the PHQ-9, GAD-7, and PAI were administered. Her results  suggest she experiences moderate depression- and anxiety-related symptomatology. When considering her endorsed symptoms during the clinical interview and on subjective measures, diagnoses of F33.1 Major Depressive Disorder, Recurrent, Moderate and F41.1 Generalized Anxiety Disorder appear warranted. Given the limited scope of the evaluation, it was unable to be determined if full criteria for a sleep-wake disorder is met or if the symptoms are better explained by her diagnoses of ADHD, Major Depressive Disorder (MDD), and Generalized Anxiety Disorder (GAD). As such, she would likely benefit from further evaluation of these symptoms to definitively rule in or out sleep-wake disorder. It does not appear that her diagnoses of MDD and GAD better account for her ADHD symptomatology, as she described her ADHD-related concerns as consistent and independent of mood as well as indicated her ADHD-related concerns have persisted despite treatment and management of her depression and anxiety-related symptomatology.   DSM-5 Diagnostic Impressions: F90.2 Attention-Deficit/Hyperactivity Disorder, Combined Presentation, Moderate F33.1 Major  Depressive Disorder, Recurrent, Moderate  F41.1 Generalized Anxiety Disorder  RECOMMENDATIONS: 1. Ms. Shawn would likely benefit from making use of strategies for ADHD symptoms:  a. Setting a timer to complete tasks. b. Breaking tasks into manageable chunks and spreading them out over longer periods of time with breaks.  c. Utilizing lists and day calendars to keep track of tasks.  d. Answering emails daily.  e. Improving listening skills by asking the speaker to give information in smaller chunks and asking for explanation for clarification as needed. f. Leaving more than the anticipated time to complete tasks. 2. It may help to keep tasks brief, well within your attention span, and a mix of both high and low interest tasks. Tasks may be gradually increased in length. 3. Practice proactive planning by setting aside time every evening to plan for the next day (e.g., prepare needed materials or pack the car the night before).  4. Learn how to make an effective and reasonable "to do" list of important tasks and priorities and always keep it easily accessible. Make additional copies in case it is lost or misplaced. 5. Utilize visual reminders by posting appointments, "to do lists," or schedule in strategic areas at home and at work.  6. Practice using an appointment book, smart phone or other tech device, or a daily planning calendar, and learn to write down appointments and commitments immediately. 7. Keep notepads or use a portable audio recorder to capture important ideas that would be beneficial to recall later. 8. Learn and practice time management skills. Purchase a programmable alarm watch or set an alarm on smartphone to avoid losing track of time.  9. Use a color-coded file system, desk and closet organizers, storage boxes, or other organization devices to reduce clutter and improve efficiency and structure.  10. Implement ways to become more aware of your actions and to inhibit or adjust  them as warranted (e.g., reviewing videos of your actions, consider consequences of obeying or not obeying the rules of various upcoming situations, have a trusted other to discuss plans with and/or provide cues to stop certain behaviors, and make visual cues for rules you would like to follow). 11. Stay flexible and be prepared to change your plans as symptom breakthroughs and crises are likely to occur periodically. 12. Ms. Nolton may benefit from mindfulness training to address symptoms of inattention.  13. Ms. Sivils would likely benefit from a consultation regarding medication for ADHD symptoms.   14. Individual therapeutic services may assist in processing a diagnosis of ADHD and discussing coping and compensatory  strategies. 15. Mental alertness/energy can be raised by increasing exercise; improving sleep; eating a healthy diet; and managing depression and anxiety. Consulting with a physician regarding any changes to physical regimen is recommended. 16. "Failing at Normal: An ADHD Success Story" by Calvert Cantor is a great overview of ADHD. Dr. Janese Banks also has a YouTube channel with helpful videos on ADHD-related topics: http://www.mitchell-reyes.biz/ 17. Applications:   RescueTime. Tracks your activities on phone and/or computer to determine how productive you have been, and what distracted you. Free two week trial.   Focus@Will . Uses engineered audio that human voice-like frequencies. Free 15-day trial.  Freedom. Allows you to highlight days and times you want to block yourself from certain sites or apps. Free trial.  Mint. Allows you to input your bank accounts and creates a visual layout of various information about your financial goals, budget management, alerts, etc. Free.  Boomerang. Gives you the option to schedule times an email is sent as well as to see if others have received or opened your email. 10 messages free per month and a free trial of  premium version.  IFTTT. Uses "channels" to create various actions (e.g., if you are mentioned in an email to highlight it in your inbox and if you miss a call to add it to a to-do list). Free and premium versions.  Unroll.me. Cleans up your email by unsubscribing from what you do not want to receive while still getting everything you do. Free.  Finish. Allows you to divide two-list tasks into short-term, mid-term, and long-term as well as how much time is left for a task. Focus mode hides non-priority tasks.   Autosilent. Turns your phone ringer on and off based on specified calendars, geo-fences, timers, etc. $3.99.  Freakyalarm. Makes you solve math problems to disable an alarm. $1.99.  Wake N Shake. Makes you vigorously shake your phone to stop the alarm. $.99.  Todoist. Allows you to add sub-tasks to tasks as well as includes email and Web plugins to make it work across system. Premium has location-based reminders, calendar sync, productive tracking, etc.   Sleep Cycle. Utilizes your phone's motion sensors to notice movement while you are asleep. The alarm will wake you as early as 30 minutes before your alarm based on your lightest phase of sleep as well as showing you how daily activities affect your sleep quality.  18. Book:  "Taking Charge of Adult ADHD Second Edition" by Dr. Janese Banks 19. Organizations that are a good source of information on ADHD:   Children and Adults with Attention-Deficit/Hyperactivity Disorder (CHADD): chadd.org   Attention Deficit Disorder Association (ADDA): HotterNames.de  ADD Resources: addresources.org  ADD WareHouse: addwarehouse.com  World Federation of ADHD: adhd-federation.org  ADDConsults: FightListings.se.  Compilation of ADHD resources: https://www.harrell.com/ 20. Future evaluation if deemed necessary and/or to determine effectiveness of recommended interventions.   Helmut Muster, Psy.D. Licensed Psychologist - [facility]  #[id number]          Margarite Gouge, PsyD

## 2023-01-28 ENCOUNTER — Ambulatory Visit: Payer: No Typology Code available for payment source | Admitting: Psychology

## 2023-04-18 ENCOUNTER — Other Ambulatory Visit: Payer: Self-pay | Admitting: Family Medicine

## 2023-06-19 ENCOUNTER — Other Ambulatory Visit: Payer: Self-pay | Admitting: Family Medicine

## 2023-07-20 ENCOUNTER — Encounter: Payer: Self-pay | Admitting: Family Medicine

## 2023-07-20 ENCOUNTER — Ambulatory Visit (INDEPENDENT_AMBULATORY_CARE_PROVIDER_SITE_OTHER): Payer: No Typology Code available for payment source | Admitting: Family Medicine

## 2023-07-20 VITALS — BP 98/56 | HR 57 | Ht 63.0 in | Wt 134.0 lb

## 2023-07-20 DIAGNOSIS — F909 Attention-deficit hyperactivity disorder, unspecified type: Secondary | ICD-10-CM | POA: Diagnosis not present

## 2023-07-20 DIAGNOSIS — Z1231 Encounter for screening mammogram for malignant neoplasm of breast: Secondary | ICD-10-CM

## 2023-07-20 DIAGNOSIS — Z23 Encounter for immunization: Secondary | ICD-10-CM | POA: Diagnosis not present

## 2023-07-20 DIAGNOSIS — F331 Major depressive disorder, recurrent, moderate: Secondary | ICD-10-CM

## 2023-07-20 DIAGNOSIS — F411 Generalized anxiety disorder: Secondary | ICD-10-CM

## 2023-07-20 MED ORDER — FLUOXETINE HCL 20 MG PO TABS: 20.0000 mg | ORAL_TABLET | Freq: Every day | ORAL | 1 refills | Status: DC

## 2023-07-20 NOTE — Progress Notes (Signed)
 Established Patient Office Visit  Subjective  Patient ID: Brenda Sutton, female    DOB: 1970/09/20  Age: 53 y.o. MRN: [id number]  Chief Complaint  Patient presents with   Medical Management of Chronic Issues    HPI  F/u MDD/anxiety -overall doing well with work.  She does enjoy her job.  She is currently on fluoxetine  and happy with her current dosing regimen.  She still has about 10 of the alprazolam  leftover from when we had refilled it back in the spring so does not need it currently refill but would like keep it on her med list.   Had psych evaluation oin June/July of this year for ADHD.  she was dx with ADHD. She is adderral XR 10mg  does feel like the medication has been helpful although there are still some areas that she is struggling in but is not interested in going up on the medicine at least not yet.  She did have some difficulty getting the medication last month they had to send in 2 of the 5 mg doses.     ROS    Objective:     BP (!) 98/56   Pulse (!) 57   Ht 5' 3 (1.6 m)   Wt 134 lb (60.8 kg)   LMP 07/26/2017 (LMP Unknown)   SpO2 97%   BMI 23.74 kg/m    Physical Exam Vitals and nursing note reviewed.  Constitutional:      Appearance: Normal appearance.  HENT:     Head: Normocephalic and atraumatic.  Eyes:     Conjunctiva/sclera: Conjunctivae normal.  Cardiovascular:     Rate and Rhythm: Normal rate and regular rhythm.  Pulmonary:     Effort: Pulmonary effort is normal.     Breath sounds: Normal breath sounds.  Musculoskeletal:     Cervical back: Neck supple. No tenderness.  Lymphadenopathy:     Cervical: No cervical adenopathy.  Skin:    General: Skin is warm and dry.  Neurological:     Mental Status: She is alert.  Psychiatric:        Mood and Affect: Mood normal.      No results found for any visits on 07/20/23.    The ASCVD Risk score (Arnett DK, et al., 2019) failed to calculate for the following reasons:   Cannot find a  previous HDL lab   Cannot find a previous total cholesterol lab    Assessment & Plan:   Problem List Items Addressed This Visit       Other   Moderate episode of recurrent major depressive disorder (HCC) - Primary   You fluoxetine .  Continue as needed use of alprazolam  sparingly.      Relevant Medications   FLUoxetine  (PROZAC ) 20 MG tablet   GAD (generalized anxiety disorder)   Relevant Medications   FLUoxetine  (PROZAC ) 20 MG tablet   Adult ADHD   I am happy to take over her prescription we can move that back into primary care.  She will be due for refill at the end of the month.  She says she will reach out to psychiatry and let them know that she will be transferring this to primary care and I will be happy to take over her prescriptions at that point.  She is also going to check with her insurance to see if they will cover a 90-day supply.      Other Visit Diagnoses       Encounter for screening mammogram for malignant  neoplasm of breast       Relevant Orders   MM 3D SCREENING MAMMOGRAM BILATERAL BREAST     Encounter for immunization       Relevant Orders   Flu vaccine trivalent PF, 6mos and older(Flulaval,Afluria,Fluarix,Fluzone) (Completed)   Pfizer Comirnaty Covid-19 Vaccine 28yrs & older (Completed)     Screening mammogram for breast cancer            Return in about 6 months (around 01/17/2024).    Dorothyann Byars, MD

## 2023-07-20 NOTE — Assessment & Plan Note (Signed)
 I am happy to take over her prescription we can move that back into primary care.  She will be due for refill at the end of the month.  She says she will reach out to psychiatry and let them know that she will be transferring this to primary care and I will be happy to take over her prescriptions at that point.  She is also going to check with her insurance to see if they will cover a 90-day supply.

## 2023-07-20 NOTE — Assessment & Plan Note (Signed)
 You fluoxetine.  Continue as needed use of alprazolam sparingly.

## 2023-09-09 ENCOUNTER — Ambulatory Visit: Payer: No Typology Code available for payment source

## 2024-04-09 ENCOUNTER — Other Ambulatory Visit: Payer: Self-pay | Admitting: Family Medicine

## 2024-04-09 DIAGNOSIS — F411 Generalized anxiety disorder: Secondary | ICD-10-CM

## 2024-04-09 DIAGNOSIS — F331 Major depressive disorder, recurrent, moderate: Secondary | ICD-10-CM

## 2024-04-13 NOTE — Telephone Encounter (Signed)
  Pt was to return in about 6 months (around 01/17/2024). Please call her to schedule an appointment.

## 2024-04-13 NOTE — Telephone Encounter (Signed)
 Patient scheduled an appointment for 05/02/2024 at 9:10.

## 2024-04-23 ENCOUNTER — Other Ambulatory Visit: Payer: Self-pay

## 2024-04-23 ENCOUNTER — Ambulatory Visit
Admission: EM | Admit: 2024-04-23 | Discharge: 2024-04-23 | Disposition: A | Discharge status: Home / Self Care | Attending: Family Medicine | Admitting: Family Medicine

## 2024-04-23 DIAGNOSIS — M7989 Other specified soft tissue disorders: Secondary | ICD-10-CM | POA: Diagnosis not present

## 2024-04-23 DIAGNOSIS — W5501XA Bitten by cat, initial encounter: Secondary | ICD-10-CM | POA: Diagnosis not present

## 2024-04-23 DIAGNOSIS — S61239A Puncture wound without foreign body of unspecified finger without damage to nail, initial encounter: Secondary | ICD-10-CM

## 2024-04-23 MED ORDER — PREDNISONE 50 MG PO TABS: ORAL_TABLET | ORAL | 0 refills | Status: DC

## 2024-04-23 MED ORDER — AMOXICILLIN-POT CLAVULANATE 875-125 MG PO TABS: 1.0000 | ORAL_TABLET | Freq: Two times a day (BID) | ORAL | 0 refills | Status: DC

## 2024-04-23 NOTE — ED Notes (Signed)
 Finger wrapped in telfa and coban per ragan NP

## 2024-04-23 NOTE — Discharge Instructions (Addendum)
 Advised patient to take medications as directed with food to completion.  Advised to take prednisone with first dose of Augmentin  for the next 5 of 10 days.  Encouraged to increase daily water intake to 64 ounces per day while taking this medication.  Advised if symptoms worsen and/or unresolved please follow-up with PCP or here for further evaluation.

## 2024-04-23 NOTE — ED Provider Notes (Signed)
 [facility]    CSN: [id number] Arrival date & time: 04/23/24  1220      History   Chief Complaint Chief Complaint  Patient presents with   Animal Bite    Right middle finger      HPI Brenda Sutton is a 53 y.o. female.   HPI pleasant 53 year old female presents with multiple cat bites of right middle finger.  PMH significant for adult ADHD, MDD, and hypothyroidism.  Past Medical History:  Diagnosis Date   Acute cystitis    Anemia    h/o   Depression    Environmental allergies    grass    Right ovarian cyst    Suicide attempt (HCC)    at age 8   UTI (urinary tract infection)     Patient Active Problem List   Diagnosis Date Noted   Adult ADHD 09/08/2022   Moderate episode of recurrent major depressive disorder (HCC) 06/26/2022   Seasonal allergies 11/13/2021   Hot flash, menopausal 11/13/2021   Right ovarian cyst 08/18/2017   Routine general medical examination at a health care facility 01/11/2016   Metatarsophalangeal (joint) sprain 10/27/2013   Hypothyroidism 05/16/2013   BENIGN POSITIONAL VERTIGO 10/27/2008   Depression 07/30/2007   GAD (generalized anxiety disorder) 11/03/2006    Past Surgical History:  Procedure Laterality Date    tear duct   age 60   minor injury from toy had surgery to repair ; no issues post op    ROBOTIC ASSISTED BILATERAL SALPINGO OOPHERECTOMY Left 08/18/2017   Procedure: XI ROBOTIC ASSISTED LEFT SALPINGO OOPHORECTOMY;  Surgeon: Eloy Herring, MD;  Location: [facility];  Service: Gynecology;  Laterality: Left;   TONSILLECTOMY  age 69    childhood     OB History   No obstetric history on file.      Home Medications    Prior to Admission medications   Medication Sig Start Date End Date Taking? Authorizing Provider  amoxicillin -clavulanate (AUGMENTIN ) 875-125 MG tablet Take 1 tablet by mouth 2 (two) times daily for 10 days. 04/23/24 05/03/24 Yes Teddy Sharper, FNP  predniSONE (DELTASONE) 50 MG tablet Take 1 tab  p.o. daily for 5 days. 04/23/24  Yes Teddy Sharper, FNP  ALPRAZolam  (XANAX ) 0.25 MG tablet Take 1-2 tablets (0.25-0.5 mg total) by mouth daily as needed for anxiety. 09/08/22   Alvan Dorothyann BIRCH, MD  amphetamine-dextroamphetamine (ADDERALL XR) 10 MG 24 hr capsule Take 10 mg by mouth every morning. 05/20/23   [provider]  estradiol  (ESTRACE  VAGINAL) 0.1 MG/GM vaginal cream Place 1 applicatorful to the vagina  twice weekly. 09/08/22   Alvan Dorothyann BIRCH, MD  fexofenadine (ALLEGRA) 180 MG tablet Take 180 mg by mouth daily. 12/11/21   [provider]  FLUoxetine  (PROZAC ) 20 MG tablet Take 1 tablet (20 mg total) by mouth daily. 04/14/24   Alvan Dorothyann BIRCH, MD    Family History Family History  Problem Relation Age of Onset   Lymphoma Father        Non hodgkins    Depression Other     Social History Social History   Tobacco Use   Smoking status: Never   Smokeless tobacco: Never  Vaping Use   Vaping status: Never Used  Substance Use Topics   Alcohol use: Yes    Alcohol/week: 2.0 standard drinks of alcohol    Types: 2 Standard drinks or equivalent per week   Drug use: Yes    Types: Marijuana    Comment: denies  Allergies   Erythromycin and Prozac  [fluoxetine ]   Review of Systems Review of Systems  Skin:  Positive for wound.     Physical Exam Triage Vital Signs ED Triage Vitals  Encounter Vitals Group     BP      Girls Systolic BP Percentile      Girls Diastolic BP Percentile      Boys Systolic BP Percentile      Boys Diastolic BP Percentile      Pulse      Resp      Temp      Temp src      SpO2      Weight      Height      Head Circumference      Peak Flow      Pain Score      Pain Loc      Pain Education      Exclude from Growth Chart    No data found.  Updated Vital Signs BP 112/73   Pulse 76   Temp 98.2 F (36.8 C)   Resp 19   LMP 07/26/2017 (LMP Unknown)   SpO2 98%    Physical Exam Vitals and nursing note  reviewed.  Constitutional:      Appearance: Normal appearance. She is normal weight.  HENT:     Head: Normocephalic and atraumatic.     Mouth/Throat:     Mouth: Mucous membranes are moist.     Pharynx: Oropharynx is clear.  Eyes:     Extraocular Movements: Extraocular movements intact.     Conjunctiva/sclera: Conjunctivae normal.     Pupils: Pupils are equal, round, and reactive to light.  Cardiovascular:     Rate and Rhythm: Normal rate and regular rhythm.     Pulses: Normal pulses.     Heart sounds: Normal heart sounds.  Pulmonary:     Effort: Pulmonary effort is normal.     Breath sounds: Normal breath sounds. No wheezing, rhonchi or rales.  Musculoskeletal:        General: Normal range of motion.  Skin:    General: Skin is warm and dry.     Comments: Right middle finger: 4 puncture wounds noted please see images below  Neurological:     General: No focal deficit present.     Mental Status: She is alert and oriented to person, place, and time.  Psychiatric:        Mood and Affect: Mood normal.        Behavior: Behavior normal.         UC Treatments / Results  Labs (all labs ordered are listed, but only abnormal results are displayed) Labs Reviewed - No data to display  EKG   Radiology No results found.  Procedures Procedures (including critical care time)  Medications Ordered in UC Medications - No data to display  Initial Impression / Assessment and Plan / UC Course  I have reviewed the triage vital signs and the nursing notes.  Pertinent labs & imaging results that were available during my care of the patient were reviewed by me and considered in my medical decision making (see chart for details).     MDM: 1.  Puncture wound of finger, initial encounter-Rx'd Augmentin  875/125 mg tablet: Take 1 tablet twice daily x 10 days; 2.  Cat bite, initial encounter-Rx'd Augmentin  875/125 mg tablet: Take 1 tablet twice daily x 10 days; 3.  Swelling of finger-Rx'd  prednisone 50 mg tablet:  Take 1 tablet daily x 5 days.  Right middle finger dressed with bandage prior to discharge. Advised patient to take medications as directed with food to completion.  Advised to take prednisone with first dose of Augmentin  for the next 5 of 10 days.  Encouraged to increase daily water intake to 64 ounces per day while taking this medication.  Advised if symptoms worsen and/or unresolved please follow-up with PCP or here for further evaluation.  Patient discharged home, hemodynamically stable. Final Clinical Impressions(s) / UC Diagnoses   Final diagnoses:  Cat bite, initial encounter  Puncture wound of finger, initial encounter  Swelling of finger     Discharge Instructions      Advised patient to take medications as directed with food to completion.  Advised to take prednisone with first dose of Augmentin  for the next 5 of 10 days.  Encouraged to increase daily water intake to 64 ounces per day while taking this medication.  Advised if symptoms worsen and/or unresolved please follow-up with PCP or here for further evaluation.     ED Prescriptions     Medication Sig Dispense Auth. Provider   amoxicillin -clavulanate (AUGMENTIN ) 875-125 MG tablet Take 1 tablet by mouth 2 (two) times daily for 10 days. 20 tablet Eddy Termine, FNP   predniSONE (DELTASONE) 50 MG tablet Take 1 tab p.o. daily for 5 days. 5 tablet Dacotah Cabello, FNP      PDMP not reviewed this encounter.   Teddy Sharper, FNP 04/23/24 [phone]

## 2024-04-23 NOTE — ED Triage Notes (Signed)
 Pt presents to uc with co cat bite to her right middle finger this morning from her own cat who she reports is up to date on vaccination. Swelling to the right middle finger and multiple puncture sites. Pt has used ice  to the area.

## 2024-05-02 ENCOUNTER — Encounter: Payer: Self-pay | Admitting: Family Medicine

## 2024-05-02 ENCOUNTER — Ambulatory Visit (INDEPENDENT_AMBULATORY_CARE_PROVIDER_SITE_OTHER): Admitting: Family Medicine

## 2024-05-02 VITALS — BP 115/64 | HR 73 | Ht 63.0 in | Wt 138.1 lb

## 2024-05-02 DIAGNOSIS — F331 Major depressive disorder, recurrent, moderate: Secondary | ICD-10-CM

## 2024-05-02 DIAGNOSIS — R7989 Other specified abnormal findings of blood chemistry: Secondary | ICD-10-CM | POA: Diagnosis not present

## 2024-05-02 DIAGNOSIS — F909 Attention-deficit hyperactivity disorder, unspecified type: Secondary | ICD-10-CM | POA: Diagnosis not present

## 2024-05-02 DIAGNOSIS — Z13220 Encounter for screening for lipoid disorders: Secondary | ICD-10-CM

## 2024-05-02 DIAGNOSIS — Z5181 Encounter for therapeutic drug level monitoring: Secondary | ICD-10-CM

## 2024-05-02 DIAGNOSIS — Z1231 Encounter for screening mammogram for malignant neoplasm of breast: Secondary | ICD-10-CM

## 2024-05-02 NOTE — Progress Notes (Signed)
 Established Patient Office Visit  Patient ID: Brenda Sutton, female    DOB: 05/31/1971  Age: 53 y.o. MRN: [id number] PCP: Alvan Brenda BIRCH, MD  Chief Complaint  Patient presents with   mood    Subjective:     HPI  Discussed the use of AI scribe software for clinical note transcription with the patient, who gave verbal consent to proceed.  History of Present Illness Brenda Sutton is a 53 year old female who presents for medication management and routine health maintenance.  Psychotropic medication management - Current regimen includes fluoxetine , alprazolam , and Adderall. - Prefers to maintain current fluoxetine  dosage. - Uses alprazolam  sparingly, approximately twice monthly, typically half a pill per use. - Recently increased Adderall dosage from 10 mg to 15 mg and is satisfied with the adjustment. - Prescriptions for Adderall are submitted in sets of three, but only one can be filled at a time due to scheduling restrictions.  Menopausal symptoms - Uses topical estrogen cream for management of menopausal symptoms.  Gynecologic health maintenance - Last Pap smear was five years ago and was normal.     ROS    Objective:     BP 115/64   Pulse 73   Ht 5' 3 (1.6 m)   Wt 138 lb 1.9 oz (62.7 kg)   LMP 07/26/2017 (LMP Unknown)   SpO2 100%   BMI 24.47 kg/m    Physical Exam Vitals and nursing note reviewed.  Constitutional:      Appearance: Normal appearance.  HENT:     Head: Normocephalic and atraumatic.  Eyes:     Conjunctiva/sclera: Conjunctivae normal.  Cardiovascular:     Rate and Rhythm: Normal rate and regular rhythm.  Pulmonary:     Effort: Pulmonary effort is normal.     Breath sounds: Normal breath sounds.  Skin:    General: Skin is warm and dry.  Neurological:     Mental Status: She is alert.  Psychiatric:        Mood and Affect: Mood normal.      No results found for any visits on 05/02/24.    The ASCVD Risk score  (Arnett DK, et al., 2019) failed to calculate for the following reasons:   Cannot find a previous HDL lab   Cannot find a previous total cholesterol lab    Assessment & Plan:   Problem List Items Addressed This Visit       Other   Moderate episode of recurrent major depressive disorder (HCC)   Adult ADHD - Primary   Other Visit Diagnoses       Screening, lipid       Relevant Orders   CMP14+EGFR   Lipid panel   CBC   TSH     Abnormal TSH       Relevant Orders   CMP14+EGFR   Lipid panel   CBC   TSH     Medication monitoring encounter       Relevant Orders   CMP14+EGFR   Lipid panel   CBC   TSH     Screening mammogram for breast cancer       Relevant Orders   MM 3D SCREENING MAMMOGRAM BILATERAL BREAST       Assessment and Plan Assessment & Plan Major Depressive Disorder Moderate episode, well-controlled on fluoxetine  20 mg. - Continue fluoxetine  20 mg daily.  Generalized Anxiety Disorder Well-managed with infrequent alprazolam  use. - Continue alprazolam  as needed, use sparingly. She usually takes half  a tab  - Monitor for increased usage, assess for external stressors if usage increases.  Adult Attention-Deficit Hyperactivity Disorder (ADHD) Managed with Adderall XR 15 mg. Considering prescription transfer for convenience. - Continue Adderall XR 15 mg daily. - Coordinate transfer of prescription management if desired.  General Health Maintenance Routine health maintenance due, including screenings and vaccinations. - Schedule Pap smear screening, last one 5 years ago. - Schedule mammogram screening. - Administer blood work today. - Discuss and schedule shingles and pneumonia vaccines, note potential side effects of shingles vaccine.    Return in about 6 months (around 10/31/2024) for ADD, Mood .    Brenda Byars, MD [facility] at [facility]

## 2024-05-02 NOTE — Progress Notes (Signed)
 Pt was advised to schedule Wellness exam with pap. She is overdue for pap

## 2024-05-03 ENCOUNTER — Ambulatory Visit: Payer: Self-pay | Admitting: Family Medicine

## 2024-05-03 DIAGNOSIS — E039 Hypothyroidism, unspecified: Secondary | ICD-10-CM

## 2024-05-03 LAB — LIPID PANEL
Chol/HDL Ratio: 2.9 ratio (ref 0.0–4.4)
Cholesterol, Total: 224 mg/dL — ABNORMAL HIGH (ref 100–199)
HDL: 77 mg/dL (ref >39)
LDL Chol Calc (NIH): 124 mg/dL — ABNORMAL HIGH (ref 0–99)
Triglycerides: 135 mg/dL (ref 0–149)
VLDL Cholesterol Cal: 23 mg/dL (ref 5–40)

## 2024-05-03 LAB — CMP14+EGFR
ALT: 31 IU/L (ref 0–32)
AST: 24 IU/L (ref 0–40)
Albumin: 4.2 g/dL (ref 3.8–4.9)
Alkaline Phosphatase: 65 IU/L (ref 49–135)
BUN/Creatinine Ratio: 20 (ref 9–23)
BUN: 17 mg/dL (ref 6–24)
Bilirubin Total: 0.3 mg/dL (ref 0.0–1.2)
CO2: 24 mmol/L (ref 20–29)
Calcium: 9.5 mg/dL (ref 8.7–10.2)
Chloride: 101 mmol/L (ref 96–106)
Creatinine, Ser: 0.85 mg/dL (ref 0.57–1.00)
Globulin, Total: 2.4 g/dL (ref 1.5–4.5)
Glucose: 76 mg/dL (ref 70–99)
Potassium: 4.5 mmol/L (ref 3.5–5.2)
Sodium: 141 mmol/L (ref 134–144)
Total Protein: 6.6 g/dL (ref 6.0–8.5)
eGFR: 83 mL/min/1.73 (ref >59)

## 2024-05-03 LAB — CBC
Hematocrit: 41 % (ref 34.0–46.6)
Hemoglobin: 13.5 g/dL (ref 11.1–15.9)
MCH: 30.8 pg (ref 26.6–33.0)
MCHC: 32.9 g/dL (ref 31.5–35.7)
MCV: 93 fL (ref 79–97)
Platelets: 393 x10E3/uL (ref 150–450)
RBC: 4.39 x10E6/uL (ref 3.77–5.28)
RDW: 13.1 % (ref 11.7–15.4)
WBC: 5.2 x10E3/uL (ref 3.4–10.8)

## 2024-05-03 LAB — TSH: TSH: 21.5 u[IU]/mL — ABNORMAL HIGH (ref 0.450–4.500)

## 2024-05-03 LAB — SPECIMEN STATUS REPORT

## 2024-05-03 MED ORDER — LEVOTHYROXINE SODIUM 75 MCG PO TABS: 75.0000 ug | ORAL_TABLET | Freq: Every day | ORAL | 0 refills | Status: AC

## 2024-05-03 NOTE — Progress Notes (Signed)
 HI Mallie, your thyroid  has really shifted.  We need to start you on thyroid  medication and then recheck you levels in 6 weeks.  Cholesterol is up a little too. Rest of labs look good.

## 2024-05-13 ENCOUNTER — Encounter: Payer: Self-pay | Admitting: Urgent Care

## 2024-05-13 ENCOUNTER — Telehealth (INDEPENDENT_AMBULATORY_CARE_PROVIDER_SITE_OTHER): Admitting: Urgent Care

## 2024-05-13 DIAGNOSIS — R300 Dysuria: Secondary | ICD-10-CM

## 2024-05-13 DIAGNOSIS — R3989 Other symptoms and signs involving the genitourinary system: Secondary | ICD-10-CM | POA: Diagnosis not present

## 2024-05-13 MED ORDER — PHENAZOPYRIDINE HCL 100 MG PO TABS: 100.0000 mg | ORAL_TABLET | Freq: Three times a day (TID) | ORAL | 0 refills | Status: DC | PRN

## 2024-05-13 MED ORDER — NITROFURANTOIN MONOHYD MACRO 100 MG PO CAPS: 100.0000 mg | ORAL_CAPSULE | Freq: Two times a day (BID) | ORAL | 0 refills | Status: DC

## 2024-05-13 NOTE — Patient Instructions (Signed)
  Start taking the antibiotic twice daily until completed. You can take Pyridium up to 3 times daily for maximum of 2 days, this is only to help with the frequency and the pain.  Take on an as-needed basis. This will turn your urine orange. If no improvement, return for in office visit.

## 2024-05-13 NOTE — Progress Notes (Signed)
 Virtual Visit via Video Note  I connected with Brenda Sutton on 05/13/24 at 10:00 AM EST by a video enabled telemedicine application and verified that I am speaking with the correct person using two identifiers.  Patient Location: Home Provider Location: Office/Clinic  I discussed the limitations, risks, security, and privacy concerns of performing an evaluation and management service by video and the availability of in person appointments. I also discussed with the patient that there may be a patient responsible charge related to this service. The patient expressed understanding and agreed to proceed.   Subjective  PCP: Alvan Dorothyann BIRCH, MD  No chief complaint on file.   HPI:   Discussed the use of AI scribe software for clinical note transcription with the patient, who gave verbal consent to proceed.  History of Present Illness   Brenda Sutton is a 53 year old female who presents with symptoms suggestive of a urinary tract infection.  For the past five to six days, she has experienced dysuria during and after urination, along with urinary frequency even shortly after voiding. Urination is brief, lasting about five seconds. The urine appears cloudy, but there is no hematuria. She has not used over-the-counter UTI test strips.  She has a history of approximately six urinary tract infections as an adult. No fever, chills, nausea, or vomiting, although she mentions a 'weird' stomach feeling. She reports upper back pain but is uncertain if it is related to her symptoms or her workout routine.  She has been taking acetaminophen , which helps alleviate the pain but not the frequency of urination. She has an allergy to erythromycin.  She is currently in [place] and plans to return next Tuesday.       ROS: Per HPI. All other pertinent systems are negative.   Current Outpatient Medications:    ALPRAZolam  (XANAX ) 0.25 MG tablet, Take 1-2 tablets (0.25-0.5 mg total) by  mouth daily as needed for anxiety., Disp: 15 tablet, Rfl: 0   amphetamine-dextroamphetamine (ADDERALL XR) 15 MG 24 hr capsule, Take 15 mg by mouth every morning., Disp: , Rfl:    estradiol  (ESTRACE  VAGINAL) 0.1 MG/GM vaginal cream, Place 1 applicatorful to the vagina  twice weekly., Disp: 42.5 g, Rfl: 6   fexofenadine (ALLEGRA) 180 MG tablet, Take 180 mg by mouth daily., Disp: , Rfl:    FLUoxetine  (PROZAC ) 20 MG tablet, Take 1 tablet (20 mg total) by mouth daily., Disp: 90 tablet, Rfl: 1   levothyroxine  (SYNTHROID ) 75 MCG tablet, Take 1 tablet (75 mcg total) by mouth daily. 1 hour before food and 4 hours from vitamins, Disp: 90 tablet, Rfl: 0    Objective  Vital signs not able to be obtained due to this being a virtual visit.  Physical Exam Constitutional:      Appearance: Normal appearance. She is normal weight.  Eyes:     General: No scleral icterus.       Right eye: No discharge.        Left eye: No discharge.  Pulmonary:     Effort: Pulmonary effort is normal. No respiratory distress.  Skin:    Findings: No rash.  Neurological:     Mental Status: She is alert and oriented to person, place, and time.  Psychiatric:        Mood and Affect: Mood normal.        Behavior: Behavior normal.      Assessment & Plan Dysuria  Orders:   nitrofurantoin , macrocrystal-monohydrate, (MACROBID ) 100 MG  capsule; Take 1 capsule (100 mg total) by mouth 2 (two) times daily.   phenazopyridine (PYRIDIUM) 100 MG tablet; Take 1 tablet (100 mg total) by mouth 3 (three) times daily as needed for pain.  Suspected UTI  Orders:   nitrofurantoin , macrocrystal-monohydrate, (MACROBID ) 100 MG capsule; Take 1 capsule (100 mg total) by mouth 2 (two) times daily.   phenazopyridine (PYRIDIUM) 100 MG tablet; Take 1 tablet (100 mg total) by mouth 3 (three) times daily as needed for pain.  Assessment and Plan    Urinary tract infection Symptoms and history suggest urinary tract infection. - Prescribed  nitrofurantoin  100 mg BID for 7 days. - Prescribed phenazopyridine TID for 2 days for symptom relief. - Advised to take medications with plenty of water. - Instructed to return for urine dipstick if symptoms persist post-antibiotics.        No follow-ups on file.   I discussed the assessment and treatment plan with the patient. The patient was provided an opportunity to ask questions, and all were answered. The patient agreed with the plan and demonstrated an understanding of the instructions.   The patient was advised to call back or seek an in-person evaluation if the symptoms worsen or if the condition fails to improve as anticipated.  The above assessment and management plan was discussed with the patient. The patient verbalized understanding of and has agreed to the management plan.   Benton LITTIE Gave, PA

## 2024-05-18 ENCOUNTER — Ambulatory Visit (INDEPENDENT_AMBULATORY_CARE_PROVIDER_SITE_OTHER): Admitting: Family Medicine

## 2024-05-18 ENCOUNTER — Encounter: Payer: Self-pay | Admitting: Family Medicine

## 2024-05-18 VITALS — BP 103/60 | HR 73 | Temp 98.3°F | Ht 63.0 in | Wt 139.1 lb

## 2024-05-18 DIAGNOSIS — R519 Headache, unspecified: Secondary | ICD-10-CM

## 2024-05-18 DIAGNOSIS — M542 Cervicalgia: Secondary | ICD-10-CM

## 2024-05-18 DIAGNOSIS — R52 Pain, unspecified: Secondary | ICD-10-CM

## 2024-05-18 DIAGNOSIS — R110 Nausea: Secondary | ICD-10-CM

## 2024-05-18 DIAGNOSIS — N3000 Acute cystitis without hematuria: Secondary | ICD-10-CM

## 2024-05-18 LAB — POC SOFIA 2 FLU + SARS ANTIGEN FIA
Influenza A, POC: NEGATIVE
Influenza B, POC: NEGATIVE
SARS Coronavirus 2 Ag: NEGATIVE

## 2024-05-18 NOTE — Progress Notes (Signed)
 Acute Office Visit  Patient ID: Brenda Sutton, female    DOB: 17-Dec-1970, 53 y.o.   MRN: [id number]  PCP: Alvan Dorothyann BIRCH, MD  No chief complaint on file.   Subjective:     HPI  Discussed the use of AI scribe software for clinical note transcription with the patient, who gave verbal consent to proceed.  History of Present Illness Brenda Sutton is a 53 year old female who presents with persistent chills, headache, and body pain following a recent urinary tract infection.  Constitutional symptoms - Persistent chills since the weekend - Hot flashes and night sweats over the weekend, requiring bed rest on Sunday - Body pain that migrates to different areas - Headache ongoing since the weekend - No recent exposure to sick individuals  Urinary tract symptoms - Onset of constant urge to urinate and burning discomfort began Friday - History of recurrent urinary tract infections - Urinary symptoms resolved by today after starting antibiotics on Saturday  Oropharyngeal and neck symptoms - Swollen sensation in throat and neck - No odynophagia (pain with swallowing) - No sore throat  Gastrointestinal symptoms - Queasiness attributed to antibiotics - No significant nasal discharge - No cough  Medication use and adverse effects - Started levothyroxine  a few weeks ago, uncertain if related to current symptoms - Currently taking antibiotics for four days for urinary tract infection - Using Tylenol  for pain relief   ROS     Objective:    BP 103/60   Pulse 73   Temp 98.3 F (36.8 C) (Oral)   Ht 5' 3 (1.6 m)   Wt 139 lb 1.3 oz (63.1 kg)   LMP 07/26/2017 (LMP Unknown)   SpO2 99%   BMI 24.64 kg/m     Physical Exam Constitutional:      Appearance: Normal appearance.  HENT:     Head: Normocephalic and atraumatic.     Right Ear: Tympanic membrane, ear canal and external ear normal. There is no impacted cerumen.     Left Ear: Tympanic membrane, ear  canal and external ear normal. There is no impacted cerumen.     Nose: Nose normal.     Mouth/Throat:     Pharynx: Oropharynx is clear.  Eyes:     Conjunctiva/sclera: Conjunctivae normal.  Cardiovascular:     Rate and Rhythm: Normal rate and regular rhythm.  Pulmonary:     Effort: Pulmonary effort is normal.     Breath sounds: Normal breath sounds.  Musculoskeletal:     Cervical back: Neck supple. No tenderness.  Lymphadenopathy:     Cervical: No cervical adenopathy.  Skin:    General: Skin is warm and dry.  Neurological:     Mental Status: She is alert and oriented to person, place, and time.  Psychiatric:        Mood and Affect: Mood normal.       Results for orders placed or performed in visit on 05/18/24  POC SOFIA 2 FLU + SARS ANTIGEN FIA  Result Value Ref Range   Influenza A, POC Negative Negative   Influenza B, POC Negative Negative   SARS Coronavirus 2 Ag Negative Negative       Assessment & Plan:   Problem List Items Addressed This Visit   None Visit Diagnoses       Generalized body aches    -  Primary   Relevant Orders   POC SOFIA 2 FLU + SARS ANTIGEN FIA (Completed)   CBC  with Differential/Platelet   CMP14+EGFR     Nausea       Relevant Orders   POC SOFIA 2 FLU + SARS ANTIGEN FIA (Completed)   CBC with Differential/Platelet   CMP14+EGFR     Acute nonintractable headache, unspecified headache type       Relevant Orders   POC SOFIA 2 FLU + SARS ANTIGEN FIA (Completed)   CBC with Differential/Platelet   CMP14+EGFR     Acute cystitis without hematuria       Relevant Orders   Urinalysis, Routine w reflex microscopic   Urine Culture     Neck pain       Relevant Orders   CBC with Differential/Platelet   CMP14+EGFR       Assessment and Plan Assessment & Plan Recent urinary tract infection, improving Recent UTI with improved symptoms. Evaluated for residual infection or antibiotic resistance. - Obtained urine sample for urinalysis and  culture.  Acute generalized pain, headache, and nausea Acute onset of generalized pain, headache, and nausea. Differential includes viral infection. - Ordered CBC with differential. - Advised to report new symptoms such as fever or diarrhea.  Negative for flu and COVID today but still could be viral.   Call if any new sxs.    No orders of the defined types were placed in this encounter.   No follow-ups on file.  Dorothyann Byars, MD [facility] at [facility]

## 2024-05-19 ENCOUNTER — Ambulatory Visit: Payer: Self-pay | Admitting: Family Medicine

## 2024-05-19 DIAGNOSIS — R748 Abnormal levels of other serum enzymes: Secondary | ICD-10-CM

## 2024-05-19 LAB — CBC WITH DIFFERENTIAL/PLATELET
Basophils Absolute: 0 x10E3/uL (ref 0.0–0.2)
Basos: 0 %
EOS (ABSOLUTE): 0.1 x10E3/uL (ref 0.0–0.4)
Eos: 3 %
Hematocrit: 33.6 % — ABNORMAL LOW (ref 34.0–46.6)
Hemoglobin: 11.1 g/dL (ref 11.1–15.9)
Immature Grans (Abs): 0 x10E3/uL (ref 0.0–0.1)
Immature Granulocytes: 0 %
Lymphocytes Absolute: 1.1 x10E3/uL (ref 0.7–3.1)
Lymphs: 30 %
MCH: 30.7 pg (ref 26.6–33.0)
MCHC: 33 g/dL (ref 31.5–35.7)
MCV: 93 fL (ref 79–97)
Monocytes Absolute: 0.7 x10E3/uL (ref 0.1–0.9)
Monocytes: 20 %
Neutrophils Absolute: 1.6 x10E3/uL (ref 1.4–7.0)
Neutrophils: 47 %
Platelets: 374 x10E3/uL (ref 150–450)
RBC: 3.61 x10E6/uL — ABNORMAL LOW (ref 3.77–5.28)
RDW: 13.1 % (ref 11.7–15.4)
WBC: 3.6 x10E3/uL (ref 3.4–10.8)

## 2024-05-19 LAB — MICROSCOPIC EXAMINATION: Casts: NONE SEEN /LPF

## 2024-05-19 LAB — URINALYSIS, ROUTINE W REFLEX MICROSCOPIC
Bilirubin, UA: NEGATIVE
Glucose, UA: NEGATIVE
Ketones, UA: NEGATIVE
Leukocytes,UA: NEGATIVE
Nitrite, UA: NEGATIVE
Specific Gravity, UA: 1.015 (ref 1.005–1.030)
Urobilinogen, Ur: 1 mg/dL (ref 0.2–1.0)
pH, UA: 7.5 (ref 5.0–7.5)

## 2024-05-19 LAB — CMP14+EGFR
ALT: 57 IU/L — ABNORMAL HIGH (ref 0–32)
AST: 50 IU/L — ABNORMAL HIGH (ref 0–40)
Albumin: 3.7 g/dL — ABNORMAL LOW (ref 3.8–4.9)
Alkaline Phosphatase: 208 IU/L — ABNORMAL HIGH (ref 49–135)
BUN/Creatinine Ratio: 10 (ref 9–23)
BUN: 7 mg/dL (ref 6–24)
Bilirubin Total: 0.3 mg/dL (ref 0.0–1.2)
CO2: 24 mmol/L (ref 20–29)
Calcium: 9.6 mg/dL (ref 8.7–10.2)
Chloride: 102 mmol/L (ref 96–106)
Creatinine, Ser: 0.73 mg/dL (ref 0.57–1.00)
Globulin, Total: 3 g/dL (ref 1.5–4.5)
Glucose: 88 mg/dL (ref 70–99)
Potassium: 4.4 mmol/L (ref 3.5–5.2)
Sodium: 136 mmol/L (ref 134–144)
Total Protein: 6.7 g/dL (ref 6.0–8.5)
eGFR: 100 mL/min/1.73 (ref >59)

## 2024-05-19 NOTE — Progress Notes (Signed)
 Hi Kate, urine sample actually looks okay no sign of infection.  Your white blood cell count is normal so no sign of systemic infection interestingly though your hemoglobin is dropped just slightly have you been having any bleeding in your stool or.  That may have caused a slight drop.  If no then I recommend checking the hemoglobin in a month I want a make sure that it either stabilizes and it is not going down.  Metabolic panel looks okay except for liver function is elevated.  So organ to call the lab and see if we can add an acute hepatitis panel.

## 2024-05-20 LAB — URINE CULTURE

## 2024-05-20 NOTE — Progress Notes (Signed)
 Hi Brenda Sutton, good news the urine culture is negative.  We did add a hepatitis panel just because the liver enzymes were elevated and we are still awaiting those results since you are feeling better I suspect this is probably just viral but just wanted to make sure.

## 2024-05-22 LAB — ACUTE VIRAL HEPATITIS (HAV, HBV, HCV)
HCV Ab: NONREACTIVE
Hep A IgM: NEGATIVE
Hep B C IgM: NEGATIVE
Hepatitis B Surface Ag: NEGATIVE

## 2024-05-22 LAB — SPECIMEN STATUS REPORT

## 2024-05-22 LAB — HCV INTERPRETATION

## 2024-05-23 NOTE — Progress Notes (Signed)
 The hepatitis panel came back negative for hep AB and C.  Lets just plan to recheck those liver enzymes in about 3 to 4 weeks just to make sure that they have gone all the way back down to normal.

## 2024-05-30 ENCOUNTER — Ambulatory Visit: Payer: Self-pay

## 2024-05-30 ENCOUNTER — Ambulatory Visit: Admitting: Urgent Care

## 2024-05-30 VITALS — BP 90/57 | HR 90 | Ht 63.0 in | Wt 136.0 lb

## 2024-05-30 DIAGNOSIS — T7840XA Allergy, unspecified, initial encounter: Secondary | ICD-10-CM

## 2024-05-30 DIAGNOSIS — L270 Generalized skin eruption due to drugs and medicaments taken internally: Secondary | ICD-10-CM | POA: Diagnosis not present

## 2024-05-30 DIAGNOSIS — R748 Abnormal levels of other serum enzymes: Secondary | ICD-10-CM

## 2024-05-30 MED ORDER — HYDROXYZINE PAMOATE 25 MG PO CAPS: 25.0000 mg | ORAL_CAPSULE | Freq: Three times a day (TID) | ORAL | 0 refills | Status: AC | PRN

## 2024-05-30 MED ORDER — PREDNISONE 10 MG PO TABS: ORAL_TABLET | ORAL | 0 refills | Status: AC

## 2024-05-30 NOTE — Telephone Encounter (Signed)
  FYI Only or Action Required?: Action required by provider: request for appointment.  Patient was last seen in primary care on 05/18/2024 by Alvan Dorothyann BIRCH, MD.  Called Nurse Triage reporting Rash.  Symptoms began several days ago.  Interventions attempted: Nothing.  Symptoms are: gradually worsening.Red, itchy rash, all over.  Triage Disposition: See Physician Within 24 Hours  Patient/caregiver understands and will follow disposition?: Yes     Copied from CRM #[id number]. Topic: Clinical - Red Word Triage >> May 30, 2024  7:53 AM Darshell M wrote: Red Word that prompted transfer to Nurse Triage: Rash started 10 days ago and spreading around your body (abdomen, neck, butt). Itchy all over. Patient was sick and developed rash just as she was getting better. Started a new medication -Levothyroxin a month ago. Reason for Disposition  SEVERE itching (i.e., interferes with sleep, normal activities or school)  Answer Assessment - Initial Assessment Questions 1. APPEARANCE of RASH: What does the rash look like? (e.g., blisters, dry flaky skin, red spots, redness, sores)     Raised, red 2. SIZE: How big are the spots? (e.g., tip of pen, eraser, coin; inches, centimeters)     small 3. LOCATION: Where is the rash located?     Trunk, arms, legs,neck 4. COLOR: What color is the rash? (Note: It is difficult to assess rash color in people with darker-colored skin. When this situation occurs, simply ask the caller to describe what they see.)     red 5. ONSET: When did the rash begin?     10 days 6. FEVER: Do you have a fever? If Yes, ask: What is your temperature, how was it measured, and when did it start?     no 7. ITCHING: Does the rash itch? If Yes, ask: How bad is the itch? (Scale 1-10; or mild, moderate, severe)     severe 8. CAUSE: What do you think is causing the rash?     unsure 9. MEDICINE FACTORS: Have you started any new medicines within the last 2  weeks? (e.g., antibiotics)      levothyroxine  10. OTHER SYMPTOMS: Do you have any other symptoms? (e.g., dizziness, headache, sore throat, joint pain)       no 11. PREGNANCY: Is there any chance you are pregnant? When was your last menstrual period?       no  Protocols used: Rash or Redness - [facility]

## 2024-05-30 NOTE — Telephone Encounter (Signed)
 Patient scheduled today 05/30/2024 @ 3:40pm with whitney Crain

## 2024-05-30 NOTE — Progress Notes (Unsigned)
 Established Patient Office Visit  Subjective:  Patient ID: Brenda Sutton, female    DOB: 1971-04-15  Age: 53 y.o. MRN: [id number]  Chief Complaint  Patient presents with   Rash    Red, itchy x 10 days    HPI  Discussed the use of AI scribe software for clinical note transcription with the patient, who gave verbal consent to proceed.  History of Present Illness   Brenda Sutton is a 53 year old female who presents with a widespread itchy rash following recent antibiotic use.  She started experiencing a rash on November 12th, 2025, the day after her chills subsided. The rash began on her chest and has since spread to her trunk, wrists, scalp, back, buttocks, and upper legs. It is described as erythematous, flat, and extremely itchy, with individual dots and clusters, particularly on her flanks and abdomen. She experiences significant discomfort and has been using over-the-counter antihistamines with uncertain relief.  Prior to the rash, she experienced fatigue, chills, and headaches from November 7th to 11th, 2025, but tested negative for flu and COVID-19. Blood work indicated abnormal hemoglobin levels. The chills resolved by November 12th, but the rash appeared the following day.  She reported having a urinary tract infection (UTI) and was treated with antibiotics, nitrofurantoin , and pain medication. She completed the antibiotic course around November 14th, 2025.  She started levothyroxine  recently after lab results showed elevated TSH levels. She is on a 75 mcg dose. She questions whether the rash could be related to this new medication.  During the review of symptoms, she denied any chest tightness, wheezing, throat swelling, or hypersalivation. She reported a 'funky, weird pain' on her head that radiates, but no history of head trauma. She also mentioned experiencing heartburn over the past few days, which she attributes to dietary choices, including spicy foods.       Patient Active Problem List   Diagnosis Date Noted   Adult ADHD 09/08/2022   Moderate episode of recurrent major depressive disorder (HCC) 06/26/2022   Seasonal allergies 11/13/2021   Hot flash, menopausal 11/13/2021   Right ovarian cyst 08/18/2017   Metatarsophalangeal (joint) sprain 10/27/2013   Hypothyroidism 05/16/2013   BENIGN POSITIONAL VERTIGO 10/27/2008   Depression 07/30/2007   GAD (generalized anxiety disorder) 11/03/2006   Past Medical History:  Diagnosis Date   Acute cystitis    Anemia    h/o   Depression    Environmental allergies    grass    Right ovarian cyst    Suicide attempt (HCC)    at age 55   UTI (urinary tract infection)    Past Surgical History:  Procedure Laterality Date    tear duct   age 50   minor injury from toy had surgery to repair ; no issues post op    ROBOTIC ASSISTED BILATERAL SALPINGO OOPHERECTOMY Left 08/18/2017   Procedure: XI ROBOTIC ASSISTED LEFT SALPINGO OOPHORECTOMY;  Surgeon: Eloy Herring, MD;  Location: [facility];  Service: Gynecology;  Laterality: Left;   TONSILLECTOMY  age 53    childhood    Social History   Tobacco Use   Smoking status: Never   Smokeless tobacco: Never  Vaping Use   Vaping status: Never Used  Substance Use Topics   Alcohol use: Yes    Alcohol/week: 2.0 standard drinks of alcohol    Types: 2 Standard drinks or equivalent per week   Drug use: Yes    Types: Marijuana    Comment: denies  ROS: as noted in HPI  Objective:     BP (!) 90/57   Pulse 90   Ht 5' 3 (1.6 m)   Wt 136 lb (61.7 kg)   LMP 07/26/2017 (LMP Unknown)   SpO2 97%   BMI 24.09 kg/m  BP Readings from Last 3 Encounters:  05/30/24 (!) 90/57  05/18/24 103/60  05/02/24 115/64   Wt Readings from Last 3 Encounters:  05/30/24 136 lb (61.7 kg)  05/18/24 139 lb 1.3 oz (63.1 kg)  05/02/24 138 lb 1.9 oz (62.7 kg)      Physical Exam Vitals and nursing note reviewed. Exam conducted with a chaperone present.  Constitutional:       General: She is not in acute distress.    Appearance: Normal appearance. She is not ill-appearing, toxic-appearing or diaphoretic.  HENT:     Head: Normocephalic and atraumatic.     Right Ear: External ear normal.     Left Ear: External ear normal.     Nose: Nose normal. No congestion or rhinorrhea.     Mouth/Throat:     Mouth: Mucous membranes are moist.     Pharynx: Oropharynx is clear. No oropharyngeal exudate or posterior oropharyngeal erythema.  Eyes:     General: No scleral icterus.       Right eye: No discharge.        Left eye: No discharge.     Extraocular Movements: Extraocular movements intact.     Pupils: Pupils are equal, round, and reactive to light.  Cardiovascular:     Rate and Rhythm: Normal rate and regular rhythm.  Pulmonary:     Effort: Pulmonary effort is normal. No respiratory distress.     Breath sounds: Normal breath sounds. No stridor. No wheezing or rhonchi.  Musculoskeletal:     Cervical back: Normal range of motion and neck supple. No rigidity or tenderness.  Lymphadenopathy:     Cervical: No cervical adenopathy.  Skin:    General: Skin is warm and dry.     Coloration: Skin is not jaundiced.     Findings: No bruising, erythema or rash (maculopapular rash across torso, extending into scalp, and sporadically on arms/ wrists).  Neurological:     General: No focal deficit present.     Mental Status: She is alert and oriented to person, place, and time.     Gait: Gait normal.  Psychiatric:        Mood and Affect: Mood normal.        Behavior: Behavior normal.      No results found for any visits on 05/30/24.  Last CBC Lab Results  Component Value Date   WBC 3.6 05/18/2024   HGB 11.1 05/18/2024   HCT 33.6 (L) 05/18/2024   MCV 93 05/18/2024   MCH 30.7 05/18/2024   RDW 13.1 05/18/2024   PLT 374 05/18/2024   Last metabolic panel Lab Results  Component Value Date   GLUCOSE 88 05/18/2024   NA 136 05/18/2024   K 4.4 05/18/2024   CL 102  05/18/2024   CO2 24 05/18/2024   BUN 7 05/18/2024   CREATININE 0.73 05/18/2024   EGFR 100 05/18/2024   CALCIUM 9.6 05/18/2024   PROT 6.7 05/18/2024   ALBUMIN 3.7 (L) 05/18/2024   LABGLOB 3.0 05/18/2024   BILITOT 0.3 05/18/2024   ALKPHOS 208 (H) 05/18/2024   AST 50 (H) 05/18/2024   ALT 57 (H) 05/18/2024   ANIONGAP 3 (L) 08/13/2017   Last lipids Lab Results  Component Value Date   CHOL 224 (H) 05/02/2024   HDL 77 05/02/2024   LDLCALC 124 (H) 05/02/2024   TRIG 135 05/02/2024   CHOLHDL 2.9 05/02/2024   Last hemoglobin A1c No results found for: HGBA1C    The 10-year ASCVD risk score (Arnett DK, et al., 2019) is: 0.5%  Assessment & Plan:  Eruption due to drug -     hydrOXYzine  Pamoate; Take 1 capsule (25 mg total) by mouth every 8 (eight) hours as needed.  Dispense: 30 capsule; Refill: 0 -     predniSONE ; Take 6 tablets (60 mg total) by mouth daily with breakfast for 2 days, THEN 5 tablets (50 mg total) daily with breakfast for 2 days, THEN 4 tablets (40 mg total) daily with breakfast for 2 days, THEN 3 tablets (30 mg total) daily with breakfast for 2 days, THEN 2 tablets (20 mg total) daily with breakfast for 2 days, THEN 1 tablet (10 mg total) daily with breakfast for 2 days.  Dispense: 42 tablet; Refill: 0  Allergic reaction to drug, initial encounter -     hydrOXYzine  Pamoate; Take 1 capsule (25 mg total) by mouth every 8 (eight) hours as needed.  Dispense: 30 capsule; Refill: 0 -     predniSONE ; Take 6 tablets (60 mg total) by mouth daily with breakfast for 2 days, THEN 5 tablets (50 mg total) daily with breakfast for 2 days, THEN 4 tablets (40 mg total) daily with breakfast for 2 days, THEN 3 tablets (30 mg total) daily with breakfast for 2 days, THEN 2 tablets (20 mg total) daily with breakfast for 2 days, THEN 1 tablet (10 mg total) daily with breakfast for 2 days.  Dispense: 42 tablet; Refill: 0  Abnormal liver enzymes  Assessment and Plan    Drug eruption likely due  to nitrofurantoin  Erythematous, pruritic rash likely due to nitrofurantoin . Pt was on day 6 of 7 of abx when rash occurred. No systemic allergic symptoms. Pt concerned she is allergic to the Levothyroxine , but suspect this less likely as cause. - Prescribed prednisone  12-day taper. - Prescribed hydroxyzine  for pruritus as needed. - Continue levothyroxine  as prescribed.  Hypothyroidism Recently diagnosed, started on levothyroxine  75 mcg. - Continue levothyroxine  75 mcg daily.  Abnormal LFTs Unfortunately this can be a side effect to nitrofurantoin  as well and I highly suspect this to be the cause. Hepatitis panel was negative. Pt has follow up for lft recheck in 2 weeks. -         No follow-ups on file.   Benton LITTIE Gave, PA

## 2024-05-30 NOTE — Patient Instructions (Addendum)
 Please start the prednisone  taper as discussed. Take it with breakfast in the morning.  Take the hydroxyzine  as needed up to every 8 hours for itching.  Keep your follow up in Dec.

## 2024-05-31 ENCOUNTER — Encounter: Payer: Self-pay | Admitting: Urgent Care

## 2024-06-08 ENCOUNTER — Other Ambulatory Visit: Payer: Self-pay | Admitting: Family Medicine

## 2024-06-08 NOTE — Telephone Encounter (Signed)
 Copied from CRM #[id number]. Topic: Clinical - Medication Question >> Jun 08, 2024  3:28 PM Kevelyn M wrote: Reason for CRM: Patient calling about a new prescription she would like to start with Dr. Alvan. amphetamine-dextroamphetamine (ADDERALL XR) 15 MG 24 hr capsule. Patient's old prescription ran out and now ready for Dr. Alvan to fill this prescription.  Call back # [phone]  [facility] [address] [place], [place] [address] [phone]

## 2024-06-09 MED ORDER — AMPHETAMINE-DEXTROAMPHET ER 15 MG PO CP24: 15.0000 mg | ORAL_CAPSULE | Freq: Every morning | ORAL | 0 refills | Status: DC

## 2024-06-09 NOTE — Telephone Encounter (Signed)
 Patient requesting rx rf of Adderal XR 15mg   Last written 01/08/2024 Last O V 05/02/2024 when ADHD was addressed  Upcoming appt 01/12/226 physical

## 2024-06-21 ENCOUNTER — Encounter: Admitting: Family Medicine

## 2024-07-18 ENCOUNTER — Encounter: Payer: Self-pay | Admitting: Family Medicine

## 2024-07-18 ENCOUNTER — Other Ambulatory Visit (HOSPITAL_COMMUNITY)
Admission: RE | Admit: 2024-07-18 | Discharge: 2024-07-18 | Disposition: A | Discharge status: Home / Self Care | Source: Ambulatory Visit | Attending: Family Medicine | Admitting: Family Medicine

## 2024-07-18 ENCOUNTER — Ambulatory Visit: Admitting: Family Medicine

## 2024-07-18 VITALS — BP 98/57 | HR 74 | Ht 63.0 in | Wt 141.8 lb

## 2024-07-18 DIAGNOSIS — Z124 Encounter for screening for malignant neoplasm of cervix: Secondary | ICD-10-CM | POA: Insufficient documentation

## 2024-07-18 DIAGNOSIS — F411 Generalized anxiety disorder: Secondary | ICD-10-CM | POA: Diagnosis not present

## 2024-07-18 DIAGNOSIS — Z0000 Encounter for general adult medical examination without abnormal findings: Secondary | ICD-10-CM | POA: Diagnosis not present

## 2024-07-18 DIAGNOSIS — F909 Attention-deficit hyperactivity disorder, unspecified type: Secondary | ICD-10-CM

## 2024-07-18 DIAGNOSIS — Z23 Encounter for immunization: Secondary | ICD-10-CM

## 2024-07-18 MED ORDER — AMPHETAMINE-DEXTROAMPHET ER 15 MG PO CP24: 15.0000 mg | ORAL_CAPSULE | Freq: Every morning | ORAL | 0 refills | Status: AC

## 2024-07-18 MED ORDER — ALPRAZOLAM 0.25 MG PO TABS: 0.2500 mg | ORAL_TABLET | Freq: Every day | ORAL | 0 refills | Status: AC | PRN

## 2024-07-18 NOTE — Progress Notes (Signed)
 "  Complete physical exam  Patient: Brenda Gronewold AlbrightFemale    DOB: 1970/12/18 54 y.o.   MRN: [id number]  Chief Complaint  Patient presents with   Annual Exam    Subjective:    Brenda Sutton is a 54 y.o. female who presents today for a complete physical exam. She reports consuming a general diet. The patient does not participate in regular exercise at present. She generally feels well. She reports sleeping fairly well. She does have additional problems to discuss today.    Most recent fall risk assessment:    07/18/2024    7:24 AM  Fall Risk   Falls in the past year? 0  Number falls in past yr: 0  Injury with Fall? 0  Risk for fall due to : No Fall Risks  Follow up Falls evaluation completed     Most recent depression screenings:    07/18/2024    7:25 AM 05/18/2024   10:05 AM  PHQ 2/9 Scores  PHQ - 2 Score 0 1  PHQ- 9 Score 3 2        Patient Care Team: Alvan Brenda BIRCH, MD as PCP - General   ROS    Objective:    BP (!) 98/57   Pulse 74   Ht 5' 3 (1.6 m)   Wt 141 lb 12.8 oz (64.3 kg)   LMP 07/26/2017   SpO2 98%   BMI 25.12 kg/m     Physical Exam Exam conducted with a chaperone present.  Constitutional:      Appearance: Normal appearance.  HENT:     Head: Normocephalic and atraumatic.     Right Ear: Tympanic membrane, ear canal and external ear normal.     Left Ear: Tympanic membrane, ear canal and external ear normal.     Nose: Nose normal.     Mouth/Throat:     Pharynx: Oropharynx is clear.  Eyes:     Extraocular Movements: Extraocular movements intact.     Conjunctiva/sclera: Conjunctivae normal.     Pupils: Pupils are equal, round, and reactive to light.  Neck:     Thyroid : No thyromegaly.  Cardiovascular:     Rate and Rhythm: Normal rate and regular rhythm.  Pulmonary:     Effort: Pulmonary effort is normal.     Breath sounds: Normal breath sounds.  Chest:     Chest wall: No mass or deformity.  Breasts:    Right: Normal. No  mass, nipple discharge or skin change.     Left: Normal. No mass, nipple discharge or skin change.  Abdominal:     General: Bowel sounds are normal.     Palpations: Abdomen is soft.     Tenderness: There is no abdominal tenderness.  Genitourinary:    General: Normal vulva.     Exam position: Supine.     Pubic Area: No rash.      Labia:        Right: No rash, lesion or injury.        Left: No rash, lesion or injury.      Vagina: Normal.     Cervix: Normal.     Uterus: Normal.      Adnexa: Right adnexa normal and left adnexa normal.     Rectum: Normal.  Musculoskeletal:        General: No swelling.     Cervical back: Neck supple.  Lymphadenopathy:     Upper Body:     Right upper body: No supraclavicular,  axillary or pectoral adenopathy.     Left upper body: No supraclavicular, axillary or pectoral adenopathy.  Skin:    General: Skin is warm and dry.  Neurological:     Mental Status: She is oriented to person, place, and time.  Psychiatric:        Mood and Affect: Mood normal.        Behavior: Behavior normal.       No results found for any visits on 07/18/24.       Assessment & Plan:    Routine Health Maintenance and Physical Exam Immunization History  Administered Date(s) Administered   Influenza Split 03/16/2012   Influenza, Seasonal, Injecte, Preservative Fre 07/20/2023, 07/18/2024   Influenza,inj,Quad PF,6+ Mos 03/29/2013, 08/15/2014, 06/10/2021   Moderna Covid-19 Vaccine Bivalent Booster 83yrs & up 06/10/2021   Pfizer(Comirnaty)Fall Seasonal Vaccine 12 years and older 09/08/2022, 07/20/2023   Tdap 03/16/2012, 06/11/2019   Zoster Recombinant(Shingrix ) 07/18/2024    Health Maintenance  Topic Date Due   Hepatitis B Vaccines 19-59 Average Risk (1 of 3 - 19+ 3-dose series) Never done   Cervical Cancer Screening (HPV/Pap Cotest)  01/11/2019   Mammogram  11/23/2020   Pneumococcal Vaccine: 50+ Years (1 of 1 - PCV) Never done   COVID-19 Vaccine (4 - 2025-26  season) 03/07/2024   Zoster Vaccines- Shingrix  (2 of 2) 09/12/2024   Fecal DNA (Cologuard)  01/13/2025   DTaP/Tdap/Td (3 - Td or Tdap) 06/10/2029   Influenza Vaccine  Completed   Hepatitis C Screening  Completed   HIV Screening  Completed   HPV VACCINES  Aged Out   Meningococcal B Vaccine  Aged Out    Discussed health benefits of physical activity, and encouraged her to engage in regular exercise appropriate for her age and condition.  Problem List Items Addressed This Visit       Other   GAD (generalized anxiety disorder)   Relevant Medications   ALPRAZolam  (XANAX ) 0.25 MG tablet   Adult ADHD   Relevant Medications   amphetamine -dextroamphetamine (ADDERALL XR) 15 MG 24 hr capsule   amphetamine -dextroamphetamine (ADDERALL XR) 15 MG 24 hr capsule (Start on 08/15/2024)   Other Visit Diagnoses       Wellness examination    -  Primary     Screening for malignant neoplasm of cervix       Relevant Orders   Cytology - PAP     Encounter for immunization       Relevant Orders   Varicella-zoster vaccine IM (Completed)   Flu vaccine trivalent PF, 6mos and older(Flulaval,Afluria,Fluarix,Fluzone) (Completed)          Return in about 6 months (around 01/15/2025) for mood.    Brenda Byars, MD [facility]    "

## 2024-07-19 LAB — CYTOLOGY - PAP
Chlamydia: NEGATIVE
Comment: NEGATIVE
Comment: NEGATIVE
Comment: NEGATIVE
Comment: NORMAL
Diagnosis: NEGATIVE
High risk HPV: NEGATIVE
Neisseria Gonorrhea: NEGATIVE
Trichomonas: NEGATIVE

## 2024-07-20 ENCOUNTER — Ambulatory Visit: Payer: Self-pay | Admitting: Family Medicine

## 2024-07-20 NOTE — Progress Notes (Signed)
 Your Pap smear is normal. You are negative for HPV as well. Repeat pap smear in 5 years.

## 2024-08-12 ENCOUNTER — Other Ambulatory Visit: Payer: Self-pay | Admitting: Family Medicine

## 2024-08-12 DIAGNOSIS — E039 Hypothyroidism, unspecified: Secondary | ICD-10-CM

## 2024-08-12 DIAGNOSIS — R748 Abnormal levels of other serum enzymes: Secondary | ICD-10-CM

## 2024-08-12 NOTE — Telephone Encounter (Signed)
 Pt called and advised that she will need labs done for refills of medication

## 2024-10-31 ENCOUNTER — Ambulatory Visit: Admitting: Family Medicine
